# Patient Record
Sex: Female | Born: 1960
Health system: Southern US, Community
[De-identification: ages and names within clinical notes are randomized; demographics above are authoritative.]

## PROBLEM LIST (undated history)

## (undated) DIAGNOSIS — B029 Zoster without complications: Secondary | ICD-10-CM

## (undated) DIAGNOSIS — Z823 Family history of stroke: Secondary | ICD-10-CM

## (undated) DIAGNOSIS — O24419 Gestational diabetes mellitus in pregnancy, unspecified control: Secondary | ICD-10-CM

## (undated) DIAGNOSIS — B009 Herpesviral infection, unspecified: Secondary | ICD-10-CM

## (undated) DIAGNOSIS — E041 Nontoxic single thyroid nodule: Secondary | ICD-10-CM

## (undated) DIAGNOSIS — R51 Headache: Secondary | ICD-10-CM

## (undated) HISTORY — DX: Herpesviral infection, unspecified: B00.9

## (undated) HISTORY — DX: Nontoxic single thyroid nodule: E04.1

## (undated) HISTORY — DX: Zoster without complications: B02.9

## (undated) HISTORY — DX: Headache: R51

## (undated) HISTORY — DX: Family history of stroke: Z82.3

## (undated) HISTORY — DX: Gestational diabetes mellitus in pregnancy, unspecified control: O24.419

---

## 1991-04-28 HISTORY — PX: TUBAL LIGATION: SHX77

## 2000-03-03 ENCOUNTER — Other Ambulatory Visit: Admission: RE | Admit: 2000-03-03 | Discharge: 2000-03-03 | Payer: Self-pay | Admitting: Obstetrics and Gynecology

## 2001-03-18 ENCOUNTER — Other Ambulatory Visit: Admission: RE | Admit: 2001-03-18 | Discharge: 2001-03-18 | Payer: Self-pay | Admitting: Obstetrics and Gynecology

## 2002-05-03 ENCOUNTER — Other Ambulatory Visit: Admission: RE | Admit: 2002-05-03 | Discharge: 2002-05-03 | Payer: Self-pay | Admitting: Obstetrics and Gynecology

## 2003-05-07 ENCOUNTER — Other Ambulatory Visit: Admission: RE | Admit: 2003-05-07 | Discharge: 2003-05-07 | Payer: Self-pay | Admitting: Obstetrics and Gynecology

## 2004-05-23 ENCOUNTER — Other Ambulatory Visit: Admission: RE | Admit: 2004-05-23 | Discharge: 2004-05-23 | Payer: Self-pay | Admitting: Obstetrics and Gynecology

## 2005-08-10 ENCOUNTER — Other Ambulatory Visit: Admission: RE | Admit: 2005-08-10 | Discharge: 2005-08-10 | Payer: Self-pay | Admitting: Obstetrics & Gynecology

## 2006-09-30 ENCOUNTER — Other Ambulatory Visit: Admission: RE | Admit: 2006-09-30 | Discharge: 2006-09-30 | Payer: Self-pay | Admitting: Obstetrics and Gynecology

## 2007-12-20 ENCOUNTER — Other Ambulatory Visit: Admission: RE | Admit: 2007-12-20 | Discharge: 2007-12-20 | Payer: Self-pay | Admitting: Obstetrics and Gynecology

## 2008-03-27 HISTORY — PX: COLONOSCOPY: SHX174

## 2008-10-25 HISTORY — PX: HYSTEROSCOPY WITH NOVASURE: SHX5574

## 2008-11-23 ENCOUNTER — Ambulatory Visit (HOSPITAL_COMMUNITY): Admission: RE | Admit: 2008-11-23 | Discharge: 2008-11-23 | Payer: Self-pay | Admitting: Obstetrics & Gynecology

## 2010-08-03 LAB — CBC
HCT: 33.5 % — ABNORMAL LOW (ref 36.0–46.0)
Hemoglobin: 11.4 g/dL — ABNORMAL LOW (ref 12.0–15.0)
MCHC: 33.9 g/dL (ref 30.0–36.0)
MCV: 91.2 fL (ref 78.0–100.0)
RBC: 3.67 MIL/uL — ABNORMAL LOW (ref 3.87–5.11)
WBC: 3.5 10*3/uL — ABNORMAL LOW (ref 4.0–10.5)

## 2010-08-03 LAB — URINALYSIS, ROUTINE W REFLEX MICROSCOPIC
Bilirubin Urine: NEGATIVE
Glucose, UA: NEGATIVE mg/dL
Ketones, ur: NEGATIVE mg/dL
Nitrite: NEGATIVE
Specific Gravity, Urine: 1.025 (ref 1.005–1.030)
pH: 5.5 (ref 5.0–8.0)

## 2010-08-03 LAB — PREGNANCY, URINE: Preg Test, Ur: NEGATIVE

## 2010-09-09 NOTE — Op Note (Signed)
NAMEPENELOPE, FITTRO            ACCOUNT NO.:  [id number]   MEDICAL RECORD NO.:  [id number]          PATIENT TYPE:  AMB   LOCATION:  SDC                           FACILITY:  [facility]   PHYSICIAN:  M. Leda Quail, MD  DATE OF BIRTH:  June 11, 1960   DATE OF PROCEDURE:  11/23/2008  DATE OF DISCHARGE:                               OPERATIVE REPORT   PREOPERATIVE DIAGNOSES:  54. A 50 year old G3 P3 married white female with history of      menorrhagia.  2. Anemia.  3. Normal spontaneous vaginal delivery x3.  4. History of bilateral tubal ligation.   POSTOPERATIVE DIAGNOSES:  54. A 50 year old G3 P3 married white female with history of      menorrhagia.  2. Anemia.  3. Normal spontaneous vaginal delivery x3.  4. History of bilateral tubal ligation.   PROCEDURE:  Hysteroscopy with NovaSure ablation.   ANESTHESIA:  LMA.  Dr. Jean Rosenthal oversaw the case.   SPECIMENS:  None.   ESTIMATED BLOOD LOSS:  Minimal.   FLUIDS:  1100 mL of LR.   FLUID DEFICIT:  45 mL of lactated Ringer's.   URINE OUTPUT:  I and O catheterization performed at the beginning of the  procedure.   INDICATIONS:  Ms. Rossy is a very nice 50 year old G3 P3 married white  female who has been experiencing worsening menorrhagia over the last  year.  She has finally decided to proceed with intervention.  She did  undergo an ultrasound in November 2009 that showed a normal 8 x 3.7 x  5.2-cm uterus with no fibroids or adenomyosis and the endometrial  measures 6.8 mm.  She underwent endometrial biopsy this week that showed  proliferative endometrium.  She was not interested in birth control  pills as she has used these in the past and does not like the way they  make her feel, and she is not interested in IUD as she does not the idea  of a foreign object inside the uterus.  She has been counseled about  risks and benefits, and this is all documented in our office chart.  She  presents for procedure today.   PROCEDURE:   Patient taken to the operating room.  She is placed in  supine position.  Anesthesia is administered by the anesthesia staff  without difficulty.  Legs are positioned in the lower lithotomy position  in Shoshoni stirrups.  SCDs are on lower extremities bilaterally.  Legs are  then lifted to the high lithotomy position.  Perineum, inner thighs and  vagina are prepped and draped in normal sterile fashion.  Red rubber  Foley catheter is used is to drain the bladder of all urine.  A bivalve  speculum is placed in vagina.  The anterior lip of the cervix is grasped  with single-tooth tenaculum.  A paracervical block with 1% lidocaine  mixed 1:1 with epinephrine (100,000 units) is used to anesthetize the  cervix.  Ten mL total of the solution is used.The uterus sounds to about  8.5 cm.  Using [facility] dilators, the cervix is dilated to a #19.  A  diagnostic hysteroscope is  used with lactated ringers as the  hysteroscopic media.  The hysteroscope is advanced into the cervical  canal, and endometrial cavity is visualized.  A thin endometrium is  noted.  Tubal ostia are noted bilaterally, and photo documentation is  made.  The hysteroscope is removed.  Then, using Hegar dilators, the  cervix is dilated up to a #8.  The cervical length measures 4 cm.  Therefore, the cavity length measures 4.5 cm.  NovaSure device is  obtained.  The array is opened outside the patient.  The vacuum valve is  tapped to make sure it is working properly.  The array is closed and the  device is passed in through cervix.  The device is opened inside the  endometrial cavity.  The device is then seated by moving the device up  and down to the right and left and twisting it 90 degrees to the right  and left.  This is done twice.  The device opens to 4.2.  The cervical  seal is placed across the cervix, and cavity assessment test is  performed without difficulty.  The patient passes the cavity assessment  test without difficulty.   Then, the ablation cycle is initiated.  The  cycle lasts 1 minute and 6 seconds.  Once ablation cycle is completed,  the array is closed.  The device is removed from the cervix.  The  tenacula is removed from the anterior lip of the cervix.  No bleeding is  noted.  Hysteroscope is used to visualize endometrial cavity again, and  excellent cautery is noted.  Photo documentation is made.  At this  point, all instruments are removed from the vagina.  The Betadine prep  is cleansed from the skin.  Sponge, lap, needle and sponge counts are  correct x2.  The patient tolerates the procedure well.  30 mg IV Toradol  is given.  She is awakened from anesthesia and taken to the recovery in  stable addition.      Lum Keas, MD  Electronically Signed     MSM/MEDQ  D:  11/23/2008  T:  11/24/2008  Job:  [id number]

## 2012-05-20 ENCOUNTER — Encounter: Payer: Self-pay | Admitting: *Deleted

## 2012-07-14 ENCOUNTER — Ambulatory Visit (INDEPENDENT_AMBULATORY_CARE_PROVIDER_SITE_OTHER): Payer: BC Managed Care – PPO | Admitting: Obstetrics & Gynecology

## 2012-07-14 ENCOUNTER — Encounter: Payer: Self-pay | Admitting: Obstetrics & Gynecology

## 2012-07-14 VITALS — BP 98/60 | Ht 64.0 in | Wt 124.0 lb

## 2012-07-14 DIAGNOSIS — Z01419 Encounter for gynecological examination (general) (routine) without abnormal findings: Secondary | ICD-10-CM

## 2012-07-14 DIAGNOSIS — Z0000 Encounter for general adult medical examination without abnormal findings: Secondary | ICD-10-CM

## 2012-07-14 LAB — POCT URINALYSIS DIPSTICK
Bilirubin, UA: NEGATIVE
Blood, UA: NEGATIVE
Glucose, UA: NEGATIVE
Nitrite, UA: NEGATIVE
Urobilinogen, UA: NEGATIVE

## 2012-07-14 NOTE — Progress Notes (Signed)
52 y.o. G3P3 MarriedCaucasianF here for annual exam.  No bleeding.  Doing really well.    Patient's last menstrual period was 10/25/2008.          Sexually active: yes  The current method of family planning is tubal ligation and ablation..    Exercising: yes  The patient does not participate in regular exercise at present. Smoker:  no  Health Maintenance: Pap:  06/18/2011 (neg HR HPV) MMG:  03/03/12 additional views 04/08/2012 Colonoscopy:  12/09 BMD:  Never done one TDaP:  06/08   reports that she has never smoked. She has never used smokeless tobacco. She reports that  drinks alcohol. She reports that she does not use illicit drugs.  Past Medical History  Diagnosis Date  . HSV (herpes simplex virus) infection   . Headache     migraines    Past Surgical History  Procedure Laterality Date  . Tubal ligation  1993  . Hysteroscopy with novasure  10/2008  . Colonoscopy  12/09    normal w/Dr. Magod--next due 2019    Current Outpatient Prescriptions  Medication Sig Dispense Refill  . aspirin (ASPIRIN LOW DOSE) 81 MG tablet Take 81 mg by mouth 1 day or 1 dose.      . B Complex Vitamins (VITAMIN B COMPLEX PO) Take by mouth daily.      Marland Kitchen CALCIUM PO Take by mouth daily.      . Diphenhydramine-APAP, sleep, (TYLENOL PM EXTRA STRENGTH PO) Take by mouth as needed.      . Docosahexaenoic Acid (DHA OMEGA 3 PO) Take by mouth daily.      Marland Kitchen MAGNESIUM PO Take by mouth daily.      Marland Kitchen MELATONIN ER PO Take by mouth at bedtime.      . Montelukast Sodium (SINGULAIR PO) Take by mouth daily.      . Multiple Vitamins-Minerals (MULTIVITAMIN PO) Take by mouth daily.      . Omega-3 Fatty Acids (FISH OIL PO) Take by mouth daily.      . potassium chloride SA (K-DUR,KLOR-CON) 20 MEQ tablet Take 20 mEq by mouth daily.      Marland Kitchen SALINE NASAL SPRAY NA Place into the nose as needed.      Marland Kitchen VITAMIN D, CHOLECALCIFEROL, PO Take by mouth daily.       No current facility-administered medications for this visit.     History reviewed. No pertinent family history.  ROS:  Pertinent items are noted in HPI.  Otherwise, a comprehensive ROS was negative.  Exam:   BP 98/60  Ht 5\' 4"  (1.626 m)  Wt 124 lb (56.246 kg)  BMI 21.27 kg/m2  LMP 10/25/2008   Height: 5\' 4"  (162.6 cm)  Ht Readings from Last 3 Encounters:  07/14/12 5\' 4"  (1.626 m)    General appearance: alert, cooperative and appears stated age Head: Normocephalic, without obvious abnormality, atraumatic Neck: no adenopathy, supple, symmetrical, trachea midline and thyroid not enlarged, symmetric, no tenderness/mass/nodules Lungs: clear to auscultation bilaterally Breasts: Inspection negative, No nipple retraction or dimpling, No nipple discharge or bleeding, No axillary or supraclavicular adenopathy, Linear "ropey" area left upper outer quadrant around 2 o'clock approx 4cm from areola Heart: regular rate and rhythm Abdomen: soft, non-tender; bowel sounds normal; no masses,  no organomegaly Extremities: extremities normal, atraumatic, no cyanosis or edema Skin: Skin color, texture, turgor normal. No rashes or lesions Lymph nodes: Cervical, supraclavicular, and axillary nodes normal. No abnormal inguinal nodes palpated Neurologic: Grossly normal   Pelvic: External genitalia:  no lesions              Urethra:  normal appearing urethra with no masses, tenderness or lesions              Bartholins and Skenes: normal                 Vagina: normal appearing vagina with normal color and discharge, no lesions              Cervix: no lesions              Pap taken: no Bimanual Exam:  Uterus:  normal size, contour, position, consistency, mobility, non-tender              Adnexa: normal adnexa               Rectovaginal: Confirms               Anus:  normal sphincter tone, no lesions  A:  Well Woman with normal exam, no HRT use.  New left breast finding c/w fibrocystic changes.  P:   return annually for routine exam Recheck six weeks for  repeat breast exam due to new finding today.  Patient had MMG with call back images/ultrasound 11/13 and 12/13.  An After Visit Summary was printed and given to the patient.

## 2012-07-14 NOTE — Patient Instructions (Signed)
Please call if you have any new breast findings.  I will see you again in six weeks.

## 2012-08-29 ENCOUNTER — Encounter: Payer: Self-pay | Admitting: *Deleted

## 2012-08-30 ENCOUNTER — Ambulatory Visit (INDEPENDENT_AMBULATORY_CARE_PROVIDER_SITE_OTHER): Payer: BC Managed Care – PPO | Admitting: Obstetrics & Gynecology

## 2012-08-30 ENCOUNTER — Encounter: Payer: Self-pay | Admitting: Obstetrics & Gynecology

## 2012-08-30 VITALS — BP 90/58 | HR 60 | Resp 12

## 2012-08-30 DIAGNOSIS — N6320 Unspecified lump in the left breast, unspecified quadrant: Secondary | ICD-10-CM

## 2012-08-30 DIAGNOSIS — N63 Unspecified lump in unspecified breast: Secondary | ICD-10-CM

## 2012-08-30 NOTE — Progress Notes (Signed)
   Subjective:   52 y.o. MarriedCaucasian female presents for evaluation of left breast. Increased area of nodularity noted at AEX on 3/20.  She did have MMG 11/13 with follow-up images 12/13.  This was in [facility].  She has been seen at [facility] in the past.  Denies pain, nipple discharge, trauma, bruising.  She has been feeling the area and seems to think is about the same.  No recent infections/bites.  Review of Systems Pertinent items are noted in HPI.   Objective:   General appearance: alert and cooperative Head: Normocephalic, without obvious abnormality, atraumatic Neck: no adenopathy, supple, symmetrical, trachea midline and thyroid not enlarged, symmetric, no tenderness/mass/nodules Breasts: normal appearance, no masses or tenderness, No nipple retraction or dimpling, No nipple discharge or bleeding, positive findings: fibrocystic changes and   nodule located on the left upper outer quadrant.  This is at 2 o'clock about 4cm from areola.   Assessment:   ASSESSMENT:Patient is diagnosed with breast mass and increased nodularity LUOQ   Plan:   PLAN: Diagnostic MMG Left breast at [facility].  Pt will be contacted after I have results.

## 2012-08-30 NOTE — Patient Instructions (Signed)
We will call after your follow up is complete.

## 2012-09-01 ENCOUNTER — Telehealth: Payer: Self-pay | Admitting: *Deleted

## 2012-09-01 NOTE — Telephone Encounter (Signed)
[facility] FAXED FOR PROCEDURE TO BE DONE ON MAY 15TH @2 :00PM

## 2012-12-13 ENCOUNTER — Ambulatory Visit (INDEPENDENT_AMBULATORY_CARE_PROVIDER_SITE_OTHER): Payer: BC Managed Care – PPO | Admitting: Obstetrics & Gynecology

## 2012-12-13 VITALS — BP 102/64 | HR 60 | Resp 12 | Ht 64.25 in | Wt 123.4 lb

## 2012-12-13 DIAGNOSIS — R922 Inconclusive mammogram: Secondary | ICD-10-CM

## 2012-12-13 NOTE — Progress Notes (Signed)
Subjective:     Patient ID: Kiara Gray, female   DOB: 11-Nov-1960, 52 y.o.   MRN: [id number]  HPI  52 y.o. MarriedCaucasian female presents for evaluation of left breast. Increased area of nodularity noted at AEX on 07/14/12. She did have MMG 11/13 with follow-up images 12/13. This was in [facility]. I asked her to return for repeat exam in 6 weeks and area was still palpable.  She was sent for Diagnostic imaging.  She went to [place] where she had gone in the past.  Result is in chart.  3D imagine done on left breast as well.  Denies pain, nipple discharge, trauma, bruising. She has been feeling the area and seems to think is about the same but not completely sore. No recent infections/bites  Had MMG at [facility] on 09/08/12 which was negative.    Review of Systems  All other systems reviewed and are negative.      Objective:   Physical Exam  Constitutional: She appears well-developed and well-nourished.  HENT:  Head: Normocephalic and atraumatic.  Neck: Normal range of motion. Neck supple. No tracheal deviation present. No thyromegaly present.  Pulmonary/Chest: Right breast exhibits no inverted nipple, no mass, no nipple discharge, no skin change and no tenderness. Left breast exhibits no inverted nipple, no mass, no nipple discharge, no skin change and no tenderness. Breasts are symmetrical.  Lymphadenopathy:    She has no cervical adenopathy.       Assessment:     Breast nodularity on physical exam 07/14/12.  Now resolved.   Grade 3 breast density     Plan:     D/W pt dense breast finding and need to have yearly 3D imaging.  She knows there is still a lot of research going on with 3D MMG and that we will continue to discuss screening recommendations.  All questions answered.

## 2012-12-13 NOTE — Patient Instructions (Addendum)
Please call with any problems or changes. 

## 2012-12-16 ENCOUNTER — Encounter: Payer: Self-pay | Admitting: Obstetrics & Gynecology

## 2013-02-14 ENCOUNTER — Telehealth: Payer: Self-pay | Admitting: Obstetrics & Gynecology

## 2013-02-14 NOTE — Telephone Encounter (Signed)
Patient calling wanting to find out the brand name of her ablation she had done her a couple of years ago?

## 2013-02-14 NOTE — Telephone Encounter (Signed)
Patient had NovaSure Ablation done 11/23/08. Spoke with patient. She wanted to make sure she was recommending right procedure to her friend.   Routing to provider for fyi, will close encounter.

## 2013-03-02 ENCOUNTER — Other Ambulatory Visit: Payer: Self-pay

## 2013-06-27 ENCOUNTER — Encounter: Payer: Self-pay | Admitting: Obstetrics & Gynecology

## 2013-07-18 ENCOUNTER — Ambulatory Visit: Payer: BC Managed Care – PPO | Admitting: Obstetrics & Gynecology

## 2013-07-26 DIAGNOSIS — B029 Zoster without complications: Secondary | ICD-10-CM

## 2013-07-26 HISTORY — DX: Zoster without complications: B02.9

## 2013-08-07 ENCOUNTER — Encounter: Payer: Self-pay | Admitting: Obstetrics & Gynecology

## 2013-08-07 ENCOUNTER — Ambulatory Visit (INDEPENDENT_AMBULATORY_CARE_PROVIDER_SITE_OTHER): Payer: BC Managed Care – PPO | Admitting: Obstetrics & Gynecology

## 2013-08-07 ENCOUNTER — Telehealth: Payer: Self-pay | Admitting: Obstetrics & Gynecology

## 2013-08-07 VITALS — BP 98/58 | HR 60 | Resp 16 | Ht 64.0 in | Wt 119.2 lb

## 2013-08-07 DIAGNOSIS — Z01419 Encounter for gynecological examination (general) (routine) without abnormal findings: Secondary | ICD-10-CM

## 2013-08-07 DIAGNOSIS — Z0000 Encounter for general adult medical examination without abnormal findings: Secondary | ICD-10-CM

## 2013-08-07 DIAGNOSIS — Z124 Encounter for screening for malignant neoplasm of cervix: Secondary | ICD-10-CM

## 2013-08-07 DIAGNOSIS — N390 Urinary tract infection, site not specified: Secondary | ICD-10-CM

## 2013-08-07 MED ORDER — NITROFURANTOIN MONOHYD MACRO 100 MG PO CAPS: ORAL_CAPSULE | ORAL | Status: DC

## 2013-08-07 MED ORDER — SULFAMETHOXAZOLE-TMP DS 800-160 MG PO TABS: 1.0000 | ORAL_TABLET | Freq: Two times a day (BID) | ORAL | Status: DC

## 2013-08-07 NOTE — Progress Notes (Signed)
53 y.o. G3P3 Sep CaucasianF here for annual exam.  Separated for months.  This has been a really good decision for patient.  Husband has been in rehab due to alcohol and pills.  He was having an affair as well.    Dating a really nice man.  This is going very slowly.  Newly SA since mid-May.  Has been treated for UTI once.  Feels symptoms again today.  Labs done with health program through work [facility]([employer]).   Has lost 6 pounds.  Not actively trying.  Stress of marriage caused her weight loss.   Patient's last menstrual period was 10/25/2008.          Sexually active: yes  The current method of family planning is tubal ligation.    Exercising: yes   Smoker:  no  Health Maintenance: Pap:  06/18/11 WNL/negative HR HPV History of abnormal Pap:  no MMG:  2014 MMG, 5/14-additional views left breast-normal Colonoscopy:  12/09-repeat in 10 years BMD:   none TDaP:  2008 Screening Labs: through work, Hb today: declined, Urine today: took AZO   reports that she has never smoked. She has never used smokeless tobacco. She reports that she drinks about 1 - 1.5 ounces of alcohol per week. She reports that she does not use illicit drugs.  Past Medical History  Diagnosis Date  . HSV (herpes simplex virus) infection   . Headache(784.0)     migraines  . Gestational diabetes     Past Surgical History  Procedure Laterality Date  . Tubal ligation  1993  . Hysteroscopy with novasure  10/2008  . Colonoscopy  12/09    normal w/Dr. Magod--next due 2019    Current Outpatient Prescriptions  Medication Sig Dispense Refill  . aspirin (ASPIRIN LOW DOSE) 81 MG tablet Take 81 mg by mouth 1 day or 1 dose.      . B Complex Vitamins (VITAMIN B COMPLEX PO) Take by mouth daily.      Marland Kitchen. CALCIUM PO Take by mouth daily.      . Docosahexaenoic Acid (DHA OMEGA 3 PO) Take by mouth daily.      . fluticasone (FLONASE) 50 MCG/ACT nasal spray 50 sprays as needed.      Marland Kitchen. KRILL OIL PO Take by mouth daily.      Marland Kitchen. MAGNESIUM  PO Take by mouth daily.      Marland Kitchen. MELATONIN ER PO Take by mouth at bedtime.      . Montelukast Sodium (SINGULAIR PO) Take by mouth daily.      . Multiple Vitamins-Minerals (MULTIVITAMIN PO) Take by mouth daily.      . potassium chloride SA (K-DUR,KLOR-CON) 20 MEQ tablet Take 20 mEq by mouth daily.      Marland Kitchen. SALINE NASAL SPRAY NA Place into the nose as needed.      Marland Kitchen. VITAMIN D, CHOLECALCIFEROL, PO Take by mouth daily.       No current facility-administered medications for this visit.    Family History  Problem Relation Age of Onset  . Hypertension Father   . CVA Father   . Varicose Veins Mother     ROS:  Pertinent items are noted in HPI.  Otherwise, a comprehensive ROS was negative.  Exam:   BP 98/58  Pulse 60  Resp 16  Ht 5\' 4"  (1.626 m)  Wt 119 lb 3.2 oz (54.069 kg)  BMI 20.45 kg/m2  LMP 10/25/2008  Weight change: -6#  Height: 5\' 4"  (162.6 cm)  Ht Readings  from Last 3 Encounters:  08/07/13 5\' 4"  (1.626 m)  12/13/12 5' 4.25" (1.632 m)  07/14/12 5\' 4"  (1.626 m)    General appearance: alert, cooperative and appears stated age Head: Normocephalic, without obvious abnormality, atraumatic Neck: no adenopathy, supple, symmetrical, trachea midline and thyroid normal to inspection and palpation Lungs: clear to auscultation bilaterally Breasts: normal appearance, no masses or tenderness Heart: regular rate and rhythm Abdomen: soft, non-tender; bowel sounds normal; no masses,  no organomegaly Extremities: extremities normal, atraumatic, no cyanosis or edema Skin: Skin color, texture, turgor normal. No rashes or lesions Lymph nodes: Cervical, supraclavicular, and axillary nodes normal. No abnormal inguinal nodes palpated Neurologic: Grossly normal   Pelvic: External genitalia:  no lesions              Urethra:  normal appearing urethra with no masses, tenderness or lesions              Bartholins and Skenes: normal                 Vagina: normal appearing vagina with normal color  and discharge, no lesions              Cervix: no lesions              Pap taken: yes Bimanual Exam:  Uterus:  normal size, contour, position, consistency, mobility, non-tender              Adnexa: normal adnexa and no mass, fullness, tenderness               Rectovaginal: Confirms               Anus:  normal sphincter tone, no lesions  A:  Well Woman with normal exam UTI PMP, no HRT H/O endometrial ablation 12/09  P:   mammogram pap smear Bactrim ds BID x 5 days.  Will start suppression macrobid 100mg  after intercourse. #30/5RF Urine culture return annually or prn  An After Visit Summary was printed and given to the patient.

## 2013-08-07 NOTE — Telephone Encounter (Signed)
Lisa @ [facility] is calling to verify a prescription(Macrobid) that was sent electronically today.

## 2013-08-07 NOTE — Patient Instructions (Signed)

## 2013-08-07 NOTE — Telephone Encounter (Signed)
S/w Misty StanleyLisa clarified rx that was sent she needed to know the directions for insurance purposes.  Patient is to take 1 tablet orally as directed (after intercourse)

## 2013-08-11 ENCOUNTER — Telehealth: Payer: Self-pay

## 2013-08-11 LAB — URINE CULTURE: Colony Count: 60000

## 2013-08-11 LAB — IPS PAP SMEAR ONLY

## 2013-08-11 NOTE — Telephone Encounter (Signed)
Lmtcb//kn 

## 2013-08-11 NOTE — Addendum Note (Signed)
Addended by: Jerene BearsMILLER, Maykel Reitter S on: 08/11/2013 09:25 AM   Modules accepted: Orders

## 2013-08-11 NOTE — Telephone Encounter (Signed)
Returning a call to Kelly °

## 2013-08-11 NOTE — Telephone Encounter (Signed)
Message copied by Elisha HeadlandNIX, Totiana Everson S on Fri Aug 11, 2013 11:24 AM ------      Message from: Jerene BearsMILLER, MARY S      Created: Fri Aug 11, 2013  9:25 AM       Informed Via Mychart of result.  She does need a repeat culture in two weeks.  Aware you will call.  Order placed.              Kiara Gray,      Your urine culture is positive for E Coli--a common bacteria causing urinary tract infections.  You will need to have a repeat urine culture in about two weeks.  My nurse, Tresa EndoKelly, will call to schedule this.  Please let me know if you are not feeling better.  Once you are done with the antibiotics, you can use the Macrobid for "suppression" of another UTI.            Dr. Hyacinth MeekerMiller ------

## 2013-08-24 NOTE — Telephone Encounter (Signed)
Patient aware of results-will call back to schedule toc//kn

## 2013-12-26 ENCOUNTER — Telehealth: Payer: Self-pay | Admitting: Obstetrics & Gynecology

## 2013-12-26 ENCOUNTER — Ambulatory Visit (INDEPENDENT_AMBULATORY_CARE_PROVIDER_SITE_OTHER): Payer: BC Managed Care – PPO | Admitting: Nurse Practitioner

## 2013-12-26 ENCOUNTER — Encounter: Payer: Self-pay | Admitting: Nurse Practitioner

## 2013-12-26 VITALS — BP 100/72 | HR 64 | Temp 98.4°F | Ht 64.0 in | Wt 117.0 lb

## 2013-12-26 DIAGNOSIS — R3915 Urgency of urination: Secondary | ICD-10-CM

## 2013-12-26 DIAGNOSIS — N952 Postmenopausal atrophic vaginitis: Secondary | ICD-10-CM

## 2013-12-26 LAB — POCT URINALYSIS DIPSTICK
Bilirubin, UA: NEGATIVE
Blood, UA: NEGATIVE
GLUCOSE UA: NEGATIVE
Ketones, UA: NEGATIVE
NITRITE UA: POSITIVE
Protein, UA: NEGATIVE
UROBILINOGEN UA: NEGATIVE
pH, UA: 6

## 2013-12-26 MED ORDER — CIPROFLOXACIN HCL 500 MG PO TABS: 500.0000 mg | ORAL_TABLET | Freq: Two times a day (BID) | ORAL | Status: DC

## 2013-12-26 MED ORDER — FLUCONAZOLE 150 MG PO TABS: 150.0000 mg | ORAL_TABLET | Freq: Once | ORAL | Status: DC

## 2013-12-26 NOTE — Telephone Encounter (Signed)
Patient is not sure if she was supposed to come in for another urine check.

## 2013-12-26 NOTE — Patient Instructions (Signed)

## 2013-12-26 NOTE — Telephone Encounter (Signed)
Spoke with patient. Patient states that she thinks she has another UTI and possibly a yeast infection. Patient would like to come by to "drop off a urine sample." Advised patient that we are not able to treat without an office visit and will need a culture to test for yeast. Advised patient will need office visit for evaluation of UTI and yeast infection. Patient is agreeable. Appointment scheduled for today at 11:15am with Lauro Franklin, FNP. Patient is agreeable to date and time.   Routing to [place], FNP  Cc: Dr.Miller  Routing to provider for final review. Patient agreeable to disposition. Will close encounter

## 2013-12-26 NOTE — Progress Notes (Signed)
Subjective:     Patient ID: Kiara Gray, female   DOB: 03/19/1961, 53 y.o.   MRN: [id number]  Urinary Tract Infection  Associated symptoms include frequency and urgency. Pertinent negatives include no chills, flank pain, nausea or vomiting.    This 53 yo Fe complains of UTI symptoms for week.  She had urinary urgency and frequency along with generally not feeling well with fatigue and achy feeling.  Over the weekend she took extra Macrobid that she uses prophylactic.  That did not seem to help.   Then last pm thought maybe a yeast infection and used Monistat 1 day treatment.  She denies fever and chill.  This is a new relationship for her since last October.  Just became SA in April and has had a few infections since then.  She is postmenopausal and having vaginal dryness as well.  She wonders if that is causing an increase in symptoms as well.  Uses OTC Replens prn.     Review of Systems  Constitutional: Positive for fatigue. Negative for fever, chills and diaphoresis.  Gastrointestinal: Negative.  Negative for nausea, vomiting, abdominal pain, diarrhea, constipation and abdominal distention.  Genitourinary: Positive for urgency, frequency and vaginal discharge. Negative for dysuria, flank pain, vaginal bleeding, vaginal pain, pelvic pain and dyspareunia.  Musculoskeletal: Negative.   Skin: Negative.   Neurological: Negative.   Psychiatric/Behavioral: Negative.        Objective:   Physical Exam  Constitutional: She is oriented to person, place, and time. She appears well-developed and well-nourished. No distress.  Abdominal: Soft. She exhibits no distension. There is no tenderness. There is no rebound and no guarding.  No flank pain  Genitourinary:  White thick vaginal discharge from the Monistat.  Wet prep is not done.  Maybe slight tender over the bladder. No other adnexal tenderness. Urine: + leuk's and nitrates  Neurological: She is alert and oriented to person, place, and time.   Psychiatric: She has a normal mood and affect. Her behavior is normal. Judgment and thought content normal.       Assessment:     R/O UTI History of PC UTI on prophylactic med's Yeast vaginitis - already treated by patient     Plan:     Cipro 500 mg BID for a week Follow with urine C&S, micro TOC if urine is positive Diflucan 150 mg X 2 Discussed measures to help with atrophy and using EVOO Discussed that partner may need to be evaluated for prostatitis if continues to have infections

## 2013-12-27 LAB — URINALYSIS, MICROSCOPIC ONLY
Bacteria, UA: NONE SEEN
Casts: NONE SEEN
Crystals: NONE SEEN

## 2013-12-28 LAB — URINE CULTURE

## 2013-12-29 ENCOUNTER — Telehealth: Payer: Self-pay | Admitting: *Deleted

## 2013-12-29 MED ORDER — AMPICILLIN 500 MG PO CAPS: 500.0000 mg | ORAL_CAPSULE | Freq: Two times a day (BID) | ORAL | Status: AC

## 2013-12-29 NOTE — Telephone Encounter (Signed)
Pt notified of results.  New RX sent to [facility].  Pt will call back for TOC.

## 2013-12-29 NOTE — Telephone Encounter (Signed)
Message copied by Luisa Dago on Fri Dec 29, 2013 11:10 AM ------      Message from: Ria Comment R      Created: Thu Dec 28, 2013  2:43 PM       Let patient know that she does have infection - but this is group B strep.  She will need to change antibiotics to Ampicillin 500 mg twice a day for a week and schedule TOC in 2 weeks. ------

## 2013-12-29 NOTE — Telephone Encounter (Signed)
I have attempted to contact this patient by phone with the following results: left message to return call to [place] at [phone] on answering machine (mobile per [facility]).  Pt name was identified in voicemail.  Advised pt culture did grow bacteria and we need to change antibiotics.  Requested call back from pt to verify pharmacy.

## 2014-01-01 NOTE — Progress Notes (Signed)
Encounter reviewed by Dr. Ronold Hardgrove Silva.  

## 2014-02-26 ENCOUNTER — Encounter: Payer: Self-pay | Admitting: Nurse Practitioner

## 2014-08-14 ENCOUNTER — Ambulatory Visit (INDEPENDENT_AMBULATORY_CARE_PROVIDER_SITE_OTHER): Payer: BLUE CROSS/BLUE SHIELD | Admitting: Obstetrics & Gynecology

## 2014-08-14 ENCOUNTER — Encounter: Payer: Self-pay | Admitting: Obstetrics & Gynecology

## 2014-08-14 VITALS — BP 96/66 | HR 88 | Resp 16 | Ht 64.0 in | Wt 119.0 lb

## 2014-08-14 DIAGNOSIS — Z01419 Encounter for gynecological examination (general) (routine) without abnormal findings: Secondary | ICD-10-CM

## 2014-08-14 DIAGNOSIS — Z0000 Encounter for general adult medical examination without abnormal findings: Secondary | ICD-10-CM

## 2014-08-14 LAB — POCT URINALYSIS DIPSTICK
BILIRUBIN UA: NEGATIVE
GLUCOSE UA: NEGATIVE
Ketones, UA: NEGATIVE
LEUKOCYTES UA: NEGATIVE
NITRITE UA: NEGATIVE
Protein, UA: NEGATIVE
RBC UA: NEGATIVE
Urobilinogen, UA: NEGATIVE
pH, UA: 5

## 2014-08-14 MED ORDER — NITROFURANTOIN MONOHYD MACRO 100 MG PO CAPS: ORAL_CAPSULE | ORAL | Status: DC

## 2014-08-14 NOTE — Progress Notes (Signed)
54 y.o. G3P3 Divorced CaucasianF here for annual exam.  Divorce was finalized.  Still dating same person.  Doing well.  Had shingles last week.  Lesions are all drying up.  Didn't end up with an antiviral due to delay in being seen.  She left for a cruise the day the breakout started.    No vaginal bleeding.    Patient's last menstrual period was 04/27/2008.          Sexually active: Yes.    The current method of family planning is tubal ligation.    Exercising: Yes.    Walking, yoga Smoker:  no  Health Maintenance: Pap: 08/07/13 Neg History of abnormal Pap:  no MMG: 09/12/13 BIRADS1:neg Self Breast Exam: yes, every couple of months. Colonoscopy: 03/2008 repeat 10 years  BMD:   never TDaP: 2008 Screening Labs: not today, Hb today: no, Urine today: Negative.   reports that she has never smoked. She has never used smokeless tobacco. She reports that she drinks about 1.0 - 1.5 oz of alcohol per week. She reports that she does not use illicit drugs.  Past Medical History  Diagnosis Date  . HSV (herpes simplex virus) infection   . Headache(784.0)     migraines  . Gestational diabetes     Past Surgical History  Procedure Laterality Date  . Tubal ligation  1993  . Hysteroscopy with novasure  10/2008  . Colonoscopy  12/09    normal w/Dr. Magod--next due 2019    Current Outpatient Prescriptions  Medication Sig Dispense Refill  . aspirin (ASPIRIN LOW DOSE) 81 MG tablet Take 81 mg by mouth 1 day or 1 dose.    . B Complex Vitamins (VITAMIN B COMPLEX PO) Take by mouth daily.    Marland Kitchen CALCIUM PO Take by mouth daily.    . ciprofloxacin (CIPRO) 500 MG tablet Take 1 tablet (500 mg total) by mouth 2 (two) times daily. 14 tablet 0  . Docosahexaenoic Acid (DHA OMEGA 3 PO) Take by mouth daily.    Marland Kitchen KRILL OIL PO Take by mouth daily.    Marland Kitchen MAGNESIUM PO Take by mouth daily.    Marland Kitchen MELATONIN ER PO Take by mouth at bedtime.    . Montelukast Sodium (SINGULAIR PO) Take by mouth daily.    . Multiple  Vitamins-Minerals (MULTIVITAMIN PO) Take by mouth daily.    . potassium chloride SA (K-DUR,KLOR-CON) 20 MEQ tablet Take 20 mEq by mouth daily.    Marland Kitchen SALINE NASAL SPRAY NA Place into the nose as needed.    Marland Kitchen VITAMIN D, CHOLECALCIFEROL, PO Take by mouth daily.     No current facility-administered medications for this visit.    Family History  Problem Relation Age of Onset  . Hypertension Father   . CVA Father   . Varicose Veins Mother     ROS:  Pertinent items are noted in HPI.  Otherwise, a comprehensive ROS was negative.  Exam:   BP 96/66 mmHg  Pulse 88  Resp 16  Ht  (1.626 m)  Wt 119 lb (53.978 kg)  BMI 20.42 kg/m2  LMP 04/27/2008   Height:  (162.6 cm)  Ht Readings from Last 3 Encounters:  08/14/14  (1.626 m)  12/26/13  (1.626 m)  08/07/13  (1.626 m)   General appearance: alert, cooperative and appears stated age Head: Normocephalic, without obvious abnormality, atraumatic Neck: no adenopathy, supple, symmetrical, trachea midline and thyroid normal to inspection and palpation Lungs: clear to auscultation  bilaterally Breasts: normal appearance, no masses or tenderness Heart: regular rate and rhythm Abdomen: soft, non-tender; bowel sounds normal; no masses,  no organomegaly Extremities: extremities normal, atraumatic, no cyanosis or edema Skin: Skin color, texture, turgor normal. No rashes or lesions Lymph nodes: Cervical, supraclavicular, and axillary nodes normal. No abnormal inguinal nodes palpated Neurologic: Grossly normal   Pelvic: External genitalia:  no lesions              Urethra:  normal appearing urethra with no masses, tenderness or lesions              Bartholins and Skenes: normal                 Vagina: normal appearing vagina with normal color and discharge, no lesions              Cervix: no lesions              Pap taken: No. Bimanual Exam:  Uterus:  normal size, contour, position, consistency, mobility, non-tender               Adnexa: normal adnexa and no mass, fullness, tenderness               Rectovaginal: Confirms               Anus:  normal sphincter tone, no lesions  Chaperone was present for exam.  A:  Well Woman with normal exam PMP, no HRT H/O endometrial ablation 12/09 H/O recurrent UTIs  P: mammogram yearly pap smear 2015.  Neg HR HPV 2/13. Macrobid 100mg  post coitally.  #30/6RF. return annually or prn

## 2015-09-09 ENCOUNTER — Ambulatory Visit (INDEPENDENT_AMBULATORY_CARE_PROVIDER_SITE_OTHER): Payer: BLUE CROSS/BLUE SHIELD | Admitting: Obstetrics & Gynecology

## 2015-09-09 ENCOUNTER — Encounter: Payer: Self-pay | Admitting: Obstetrics & Gynecology

## 2015-09-09 VITALS — BP 102/62 | HR 62 | Resp 16 | Ht 64.0 in | Wt 124.6 lb

## 2015-09-09 DIAGNOSIS — Z1151 Encounter for screening for human papillomavirus (HPV): Secondary | ICD-10-CM | POA: Diagnosis not present

## 2015-09-09 DIAGNOSIS — Z01419 Encounter for gynecological examination (general) (routine) without abnormal findings: Secondary | ICD-10-CM

## 2015-09-09 DIAGNOSIS — Z0000 Encounter for general adult medical examination without abnormal findings: Secondary | ICD-10-CM | POA: Diagnosis not present

## 2015-09-09 DIAGNOSIS — Z124 Encounter for screening for malignant neoplasm of cervix: Secondary | ICD-10-CM

## 2015-09-09 LAB — POCT URINALYSIS DIPSTICK
Bilirubin, UA: NEGATIVE
Glucose, UA: NEGATIVE
KETONES UA: NEGATIVE
Leukocytes, UA: NEGATIVE
Nitrite, UA: NEGATIVE
PROTEIN UA: NEGATIVE
RBC UA: NEGATIVE
UROBILINOGEN UA: NEGATIVE
pH, UA: 5

## 2015-09-09 MED ORDER — NITROFURANTOIN MONOHYD MACRO 100 MG PO CAPS: ORAL_CAPSULE | ORAL | Status: DC

## 2015-09-09 MED ORDER — ESTRADIOL 0.1 MG/GM VA CREA
TOPICAL_CREAM | VAGINAL | Status: DC
Start: 2015-09-09 — End: 2018-05-06

## 2015-09-09 NOTE — Progress Notes (Signed)
55 y.o. [id number] MarriedCaucasianF here for annual exam.  Doing well.  No vaginal bleeding.  Dating same person now 2 1/2 to 3 years.  Not living together.    Has questions about vaginal dryness and estrogen vaginal cream.  Reports having hip pain last year.  Saw ortho.  X-ray was done and she was told she had good bone density.  She was given exercises and this made pain resolve.  PCP:  Dr. Lenise ArenaMeyers.  Has not blood work in the past year.    Patient's last menstrual period was 04/27/2008.          Sexually active: Yes.    The current method of family planning is post menopausal status.    Exercising: Yes.    walking, and weights Smoker:  no  Health Maintenance: Pap:  08/07/2013 negative History of abnormal Pap:  no MMG:  09/27/2014 BIRADS 1 negative  Colonoscopy:  2009  BMD:   none TDaP:  09/30/2006 Screening Labs: declined, Hb today: declined, Urine today: pending   reports that she has never smoked. She has never used smokeless tobacco. She reports that she drinks about 1.0 - 1.5 oz of alcohol per week. She reports that she does not use illicit drugs.  Past Medical History  Diagnosis Date  . HSV (herpes simplex virus) infection   . Headache(784.0)     migraines  . Gestational diabetes   . Shingles 4/15    Past Surgical History  Procedure Laterality Date  . Tubal ligation  1993  . Hysteroscopy with novasure  10/2008  . Colonoscopy  12/09    normal w/Dr. Magod--next due 2019    Current Outpatient Prescriptions  Medication Sig Dispense Refill  . aspirin (ASPIRIN LOW DOSE) 81 MG tablet Take 81 mg by mouth 1 day or 1 dose.    . B Complex Vitamins (VITAMIN B COMPLEX PO) Take by mouth daily.    Marland Kitchen. CALCIUM PO Take by mouth daily.    . Docosahexaenoic Acid (DHA OMEGA 3 PO) Take by mouth daily.    Marland Kitchen. KRILL OIL PO Take by mouth daily.    Marland Kitchen. MAGNESIUM PO Take by mouth daily.    Marland Kitchen. MELATONIN ER PO Take by mouth at bedtime.    . Montelukast Sodium (SINGULAIR PO) Take by mouth daily.     . Multiple Vitamins-Minerals (MULTIVITAMIN PO) Take by mouth daily.    . nitrofurantoin, macrocrystal-monohydrate, (MACROBID) 100 MG capsule 1 po as directed 30 capsule 6  . potassium chloride SA (K-DUR,KLOR-CON) 20 MEQ tablet Take 20 mEq by mouth daily.    Marland Kitchen. SALINE NASAL SPRAY NA Place into the nose as needed.    Marland Kitchen. VITAMIN D, CHOLECALCIFEROL, PO Take by mouth daily.     No current facility-administered medications for this visit.    Family History  Problem Relation Age of Onset  . Hypertension Father   . CVA Father   . Varicose Veins Mother     ROS:  Pertinent items are noted in HPI.  Otherwise, a comprehensive ROS was negative.  Exam:   General appearance: alert, cooperative and appears stated age Head: Normocephalic, without obvious abnormality, atraumatic Neck: no adenopathy, supple, symmetrical, trachea midline and thyroid normal to inspection and palpation Lungs: clear to auscultation bilaterally Breasts: normal appearance, no masses or tenderness Heart: regular rate and rhythm Abdomen: soft, non-tender; bowel sounds normal; no masses,  no organomegaly Extremities: extremities normal, atraumatic, no cyanosis or edema Skin: Skin color, texture, turgor normal. No rashes or lesions  Lymph nodes: Cervical, supraclavicular, and axillary nodes normal. No abnormal inguinal nodes palpated Neurologic: Grossly normal   Pelvic: External genitalia:  no lesions              Urethra:  normal appearing urethra with no masses, tenderness or lesions              Bartholins and Skenes: normal                 Vagina: normal appearing vagina with normal color and discharge, no lesions              Cervix: no lesions              Pap taken: Yes.   Bimanual Exam:  Uterus:  normal size, contour, position, consistency, mobility, non-tender              Adnexa: normal adnexa and no mass, fullness, tenderness               Rectovaginal: Confirms               Anus:  normal sphincter tone, no  lesions  Chaperone was present for exam.   A: Well Woman with normal exam PMP, no HRT H/O endometrial ablation 12/09 H/O recurrent UTIs Vaginal dryness H/O shingles.  She does want the vaccine this year if possible  P: mammogram yearly pap smear 2015. Neg HR HPV 2/13.  Pap and HR HPV today. Trial of estrace vaginal cream 1 gm pv twice weekly.  #42.5 gm tube/RFs sent to pharmacy. Macrobid  post coitally. #30/6RF. Rx for shingles vaccine to [facility] in [facility] Does blood work through job.  Hep C testing discussed.  Prefers not to do this today.  Will discuss next year. return annually or prn

## 2015-09-11 LAB — IPS PAP TEST WITH HPV

## 2015-10-25 ENCOUNTER — Ambulatory Visit: Payer: BLUE CROSS/BLUE SHIELD | Admitting: Obstetrics & Gynecology

## 2016-04-13 DIAGNOSIS — J3089 Other allergic rhinitis: Secondary | ICD-10-CM | POA: Diagnosis not present

## 2016-06-01 DIAGNOSIS — J301 Allergic rhinitis due to pollen: Secondary | ICD-10-CM | POA: Diagnosis not present

## 2016-08-18 DIAGNOSIS — J0140 Acute pansinusitis, unspecified: Secondary | ICD-10-CM | POA: Diagnosis not present

## 2016-09-02 DIAGNOSIS — R070 Pain in throat: Secondary | ICD-10-CM | POA: Diagnosis not present

## 2016-09-23 DIAGNOSIS — Z91010 Allergy to peanuts: Secondary | ICD-10-CM | POA: Diagnosis not present

## 2016-09-23 DIAGNOSIS — Z91018 Allergy to other foods: Secondary | ICD-10-CM | POA: Diagnosis not present

## 2016-09-23 DIAGNOSIS — J301 Allergic rhinitis due to pollen: Secondary | ICD-10-CM | POA: Diagnosis not present

## 2016-09-23 DIAGNOSIS — J3089 Other allergic rhinitis: Secondary | ICD-10-CM | POA: Diagnosis not present

## 2016-11-12 DIAGNOSIS — J3089 Other allergic rhinitis: Secondary | ICD-10-CM | POA: Diagnosis not present

## 2016-12-04 ENCOUNTER — Ambulatory Visit: Payer: BLUE CROSS/BLUE SHIELD | Admitting: Obstetrics & Gynecology

## 2016-12-04 DIAGNOSIS — Z23 Encounter for immunization: Secondary | ICD-10-CM | POA: Diagnosis not present

## 2016-12-11 DIAGNOSIS — J3081 Allergic rhinitis due to animal (cat) (dog) hair and dander: Secondary | ICD-10-CM | POA: Diagnosis not present

## 2016-12-11 DIAGNOSIS — J301 Allergic rhinitis due to pollen: Secondary | ICD-10-CM | POA: Diagnosis not present

## 2016-12-29 ENCOUNTER — Encounter: Payer: Self-pay | Admitting: Obstetrics & Gynecology

## 2016-12-29 ENCOUNTER — Ambulatory Visit (INDEPENDENT_AMBULATORY_CARE_PROVIDER_SITE_OTHER): Payer: BLUE CROSS/BLUE SHIELD | Admitting: Obstetrics & Gynecology

## 2016-12-29 VITALS — BP 92/60 | HR 70 | Resp 16 | Ht 64.0 in | Wt 122.0 lb

## 2016-12-29 DIAGNOSIS — Z01419 Encounter for gynecological examination (general) (routine) without abnormal findings: Secondary | ICD-10-CM

## 2016-12-29 DIAGNOSIS — Z23 Encounter for immunization: Secondary | ICD-10-CM

## 2016-12-29 DIAGNOSIS — Z0000 Encounter for general adult medical examination without abnormal findings: Secondary | ICD-10-CM

## 2016-12-29 LAB — POCT URINALYSIS DIPSTICK
BILIRUBIN UA: NEGATIVE
GLUCOSE UA: NEGATIVE
Ketones, UA: NEGATIVE
Leukocytes, UA: NEGATIVE
Nitrite, UA: NEGATIVE
Protein, UA: NEGATIVE
RBC UA: NEGATIVE
Urobilinogen, UA: 0.2 E.U./dL
pH, UA: 7 (ref 5.0–8.0)

## 2016-12-29 MED ORDER — NITROFURANTOIN MONOHYD MACRO 100 MG PO CAPS
ORAL_CAPSULE | ORAL | 3 refills | Status: DC
Start: 2016-12-29 — End: 2018-05-06

## 2016-12-29 NOTE — Progress Notes (Signed)
56 y.o. [id number] MarriedCaucasianF here for annual exam.  Doing well.  Denies vaginal bleeding.    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  Continues to decline blood work.    Patient's last menstrual period was 10/25/2008.          Sexually active: Yes.    The current method of family planning is post menopausal status.    Exercising: Yes.    weights, yoga, walking Smoker:  no  Health Maintenance: Pap:  09/09/15 Neg. HR HPV:neg   08/07/13 Neg  History of abnormal Pap:  no MMG:  01/21/16 BIRADS1:neg  Colonoscopy:  2009 normal. F/u 10 years  BMD:   Never TDaP:  2008.  Will update today.  Pneumonia vaccine(s):  N/A Zostavax: 11/2016 shingrix. Hep C testing: not done Screening Labs: at work Urine today: Negative    reports that she has never smoked. She has never used smokeless tobacco. She reports that she drinks about 1.0 - 1.5 oz of alcohol per week . She reports that she does not use drugs.  Past Medical History:  Diagnosis Date  . Gestational diabetes   . Headache(784.0)    migraines  . HSV (herpes simplex virus) infection   . Shingles 4/15    Past Surgical History:  Procedure Laterality Date  . COLONOSCOPY  12/09   normal w/Dr. Magod--next due 2019  . HYSTEROSCOPY WITH NOVASURE  10/2008  . TUBAL LIGATION  1993    Current Outpatient Prescriptions  Medication Sig Dispense Refill  . aspirin (ASPIRIN LOW DOSE) 81 MG tablet Take 81 mg by mouth 1 day or 1 dose.    . B Complex Vitamins (VITAMIN B COMPLEX PO) Take by mouth daily.    Marland Kitchen CALCIUM PO Take by mouth daily.    . Docosahexaenoic Acid (DHA OMEGA 3 PO) Take by mouth daily.    Marland Kitchen KRILL OIL PO Take by mouth daily.    Marland Kitchen MAGNESIUM PO Take by mouth daily.    . montelukast (SINGULAIR) 10 MG tablet Take 1 tablet by mouth daily.    . Multiple Vitamins-Minerals (MULTIVITAMIN PO) Take by mouth daily.    . nitrofurantoin, macrocrystal-monohydrate, (MACROBID) 100 MG capsule 1 po as directed 30 capsule 6  . potassium chloride SA  (K-DUR,KLOR-CON) 20 MEQ tablet Take 20 mEq by mouth daily.    Marland Kitchen SALINE NASAL SPRAY NA Place into the nose as needed.    Marland Kitchen VITAMIN D, CHOLECALCIFEROL, PO Take by mouth daily.    Marland Kitchen estradiol (ESTRACE) 0.1 MG/GM vaginal cream 1 gram vaginally twice weekly (Patient not taking: Reported on 12/29/2016) 42.5 g 3   No current facility-administered medications for this visit.     Family History  Problem Relation Age of Onset  . Hypertension Father   . CVA Father   . Varicose Veins Mother     ROS:  Pertinent items are noted in HPI.  Otherwise, a comprehensive ROS was negative.  Exam:   BP 92/60 (BP Location: Right Arm, Patient Position: Sitting, Cuff Size: Normal)   Pulse 70   Resp 16   Ht 5\' 4"  (1.626 m)   Wt 122 lb (55.3 kg)   LMP 10/25/2008   BMI 20.94 kg/m    Height: 5\' 4"  (162.6 cm)  Ht Readings from Last 3 Encounters:  12/29/16 5\' 4"  (1.626 m)  09/09/15 5\' 4"  (1.626 m)  08/14/14 5\' 4"  (1.626 m)    General appearance: alert, cooperative and appears stated age Head: Normocephalic, without obvious abnormality, atraumatic Neck: no adenopathy,  supple, symmetrical, trachea midline and thyroid normal to inspection and palpation Lungs: clear to auscultation bilaterally Breasts: normal appearance, no masses or tenderness Heart: regular rate and rhythm Abdomen: soft, non-tender; bowel sounds normal; no masses,  no organomegaly Extremities: extremities normal, atraumatic, no cyanosis or edema Skin: Skin color, texture, turgor normal. No rashes or lesions Lymph nodes: Cervical, supraclavicular, and axillary nodes normal. No abnormal inguinal nodes palpated Neurologic: Grossly normal   Pelvic: External genitalia:  no lesions              Urethra:  normal appearing urethra with no masses, tenderness or lesions              Bartholins and Skenes: normal                 Vagina: normal appearing vagina with normal color and discharge, no lesions              Cervix: no lesions               Pap taken: No. Bimanual Exam:  Uterus:  normal size, contour, position, consistency, mobility, non-tender              Adnexa: normal adnexa and no mass, fullness, tenderness               Rectovaginal: Confirms               Anus:  normal sphincter tone, no lesions  Chaperone was present for exam.  A:  Well Woman with normal exam PMP, no HRT H/o enodmetrial ablation 12/09 H/O recurrent UTIs Vaginal ablation H/o shingles--actively getting the Shingrix vaccination  P:   Mammogram guidelines reviewed.  Doing 3D MMGs. pap smear with neg HR HPV 2017.  No pap smear today Lab work done at work.  Declines doing Hep C testing today. Rx for Macrobid 100mg  post coitally.  #30/3RF. return annually or prn

## 2017-01-13 DIAGNOSIS — Z808 Family history of malignant neoplasm of other organs or systems: Secondary | ICD-10-CM | POA: Diagnosis not present

## 2017-01-13 DIAGNOSIS — L821 Other seborrheic keratosis: Secondary | ICD-10-CM | POA: Diagnosis not present

## 2017-01-13 DIAGNOSIS — L814 Other melanin hyperpigmentation: Secondary | ICD-10-CM | POA: Diagnosis not present

## 2017-01-13 DIAGNOSIS — Z86018 Personal history of other benign neoplasm: Secondary | ICD-10-CM | POA: Diagnosis not present

## 2017-07-02 DIAGNOSIS — J3089 Other allergic rhinitis: Secondary | ICD-10-CM | POA: Diagnosis not present

## 2017-07-29 DIAGNOSIS — Z1231 Encounter for screening mammogram for malignant neoplasm of breast: Secondary | ICD-10-CM | POA: Diagnosis not present

## 2017-08-03 DIAGNOSIS — J3081 Allergic rhinitis due to animal (cat) (dog) hair and dander: Secondary | ICD-10-CM | POA: Diagnosis not present

## 2017-08-03 DIAGNOSIS — J301 Allergic rhinitis due to pollen: Secondary | ICD-10-CM | POA: Diagnosis not present

## 2017-08-24 ENCOUNTER — Encounter: Payer: Self-pay | Admitting: Obstetrics & Gynecology

## 2017-09-29 DIAGNOSIS — J301 Allergic rhinitis due to pollen: Secondary | ICD-10-CM | POA: Diagnosis not present

## 2017-09-29 DIAGNOSIS — J3089 Other allergic rhinitis: Secondary | ICD-10-CM | POA: Diagnosis not present

## 2017-09-29 DIAGNOSIS — Z91010 Allergy to peanuts: Secondary | ICD-10-CM | POA: Diagnosis not present

## 2017-09-29 DIAGNOSIS — Z91018 Allergy to other foods: Secondary | ICD-10-CM | POA: Diagnosis not present

## 2017-10-11 DIAGNOSIS — M545 Low back pain: Secondary | ICD-10-CM | POA: Diagnosis not present

## 2017-12-02 DIAGNOSIS — R0989 Other specified symptoms and signs involving the circulatory and respiratory systems: Secondary | ICD-10-CM | POA: Diagnosis not present

## 2017-12-04 ENCOUNTER — Other Ambulatory Visit: Payer: Self-pay | Admitting: Physician Assistant

## 2017-12-04 DIAGNOSIS — R198 Other specified symptoms and signs involving the digestive system and abdomen: Secondary | ICD-10-CM

## 2017-12-04 DIAGNOSIS — Z823 Family history of stroke: Secondary | ICD-10-CM

## 2017-12-04 DIAGNOSIS — R0989 Other specified symptoms and signs involving the circulatory and respiratory systems: Secondary | ICD-10-CM

## 2017-12-15 ENCOUNTER — Ambulatory Visit
Admission: RE | Admit: 2017-12-15 | Discharge: 2017-12-15 | Disposition: A | Discharge status: Home / Self Care | Payer: Self-pay | Source: Ambulatory Visit | Attending: Physician Assistant | Admitting: Physician Assistant

## 2017-12-15 ENCOUNTER — Ambulatory Visit
Admission: RE | Admit: 2017-12-15 | Discharge: 2017-12-15 | Disposition: A | Discharge status: Home / Self Care | Payer: BLUE CROSS/BLUE SHIELD | Source: Ambulatory Visit | Attending: Physician Assistant | Admitting: Physician Assistant

## 2017-12-15 ENCOUNTER — Other Ambulatory Visit: Payer: Self-pay

## 2017-12-15 DIAGNOSIS — E041 Nontoxic single thyroid nodule: Secondary | ICD-10-CM | POA: Diagnosis not present

## 2017-12-15 DIAGNOSIS — Z823 Family history of stroke: Secondary | ICD-10-CM

## 2017-12-15 DIAGNOSIS — I6523 Occlusion and stenosis of bilateral carotid arteries: Secondary | ICD-10-CM | POA: Diagnosis not present

## 2017-12-15 DIAGNOSIS — R0989 Other specified symptoms and signs involving the circulatory and respiratory systems: Secondary | ICD-10-CM

## 2017-12-15 DIAGNOSIS — R198 Other specified symptoms and signs involving the digestive system and abdomen: Secondary | ICD-10-CM

## 2017-12-30 DIAGNOSIS — Z23 Encounter for immunization: Secondary | ICD-10-CM | POA: Diagnosis not present

## 2018-01-05 DIAGNOSIS — J3089 Other allergic rhinitis: Secondary | ICD-10-CM | POA: Diagnosis not present

## 2018-01-11 DIAGNOSIS — Z808 Family history of malignant neoplasm of other organs or systems: Secondary | ICD-10-CM | POA: Diagnosis not present

## 2018-01-11 DIAGNOSIS — Z86018 Personal history of other benign neoplasm: Secondary | ICD-10-CM | POA: Diagnosis not present

## 2018-01-11 DIAGNOSIS — Z411 Encounter for cosmetic surgery: Secondary | ICD-10-CM | POA: Diagnosis not present

## 2018-01-11 DIAGNOSIS — L814 Other melanin hyperpigmentation: Secondary | ICD-10-CM | POA: Diagnosis not present

## 2018-03-01 ENCOUNTER — Encounter

## 2018-03-01 ENCOUNTER — Ambulatory Visit: Payer: BLUE CROSS/BLUE SHIELD | Admitting: Obstetrics & Gynecology

## 2018-03-03 ENCOUNTER — Telehealth: Payer: Self-pay | Admitting: *Deleted

## 2018-03-03 NOTE — Telephone Encounter (Signed)
Left voicemail to call back to schedule AEX.   Ok to schedule patient late morning 11/19 - per SM

## 2018-03-09 DIAGNOSIS — J3081 Allergic rhinitis due to animal (cat) (dog) hair and dander: Secondary | ICD-10-CM | POA: Diagnosis not present

## 2018-03-09 DIAGNOSIS — J301 Allergic rhinitis due to pollen: Secondary | ICD-10-CM | POA: Diagnosis not present

## 2018-03-10 NOTE — Telephone Encounter (Signed)
Left voicemail to call back to schedule AEX.  

## 2018-04-05 NOTE — Telephone Encounter (Signed)
Routed to Sally, RN 

## 2018-04-26 ENCOUNTER — Encounter

## 2018-05-03 NOTE — Telephone Encounter (Signed)
Attempt to call patient to schedule annual. Call disconnected before patient answered.  Patient called back and spoke to front office. Scheduled annual for 05-06-18. Previous appointment cancelled by provider.   Routing to Dr Hyacinth Meeker. Encounter closed.

## 2018-05-06 ENCOUNTER — Other Ambulatory Visit (HOSPITAL_COMMUNITY)
Admission: RE | Admit: 2018-05-06 | Discharge: 2018-05-06 | Disposition: A | Discharge status: Home / Self Care | Payer: BLUE CROSS/BLUE SHIELD | Source: Ambulatory Visit | Attending: Obstetrics & Gynecology | Admitting: Obstetrics & Gynecology

## 2018-05-06 ENCOUNTER — Encounter: Payer: Self-pay | Admitting: Obstetrics & Gynecology

## 2018-05-06 ENCOUNTER — Other Ambulatory Visit: Payer: Self-pay

## 2018-05-06 ENCOUNTER — Ambulatory Visit: Payer: BLUE CROSS/BLUE SHIELD | Admitting: Obstetrics & Gynecology

## 2018-05-06 VITALS — BP 102/70 | HR 72 | Resp 16 | Ht 63.5 in | Wt 122.6 lb

## 2018-05-06 DIAGNOSIS — Z01419 Encounter for gynecological examination (general) (routine) without abnormal findings: Secondary | ICD-10-CM

## 2018-05-06 DIAGNOSIS — Z823 Family history of stroke: Secondary | ICD-10-CM

## 2018-05-06 DIAGNOSIS — Z124 Encounter for screening for malignant neoplasm of cervix: Secondary | ICD-10-CM | POA: Insufficient documentation

## 2018-05-06 DIAGNOSIS — Z1211 Encounter for screening for malignant neoplasm of colon: Secondary | ICD-10-CM | POA: Diagnosis not present

## 2018-05-06 NOTE — Progress Notes (Signed)
58 y.o. [id number] Divorced White or Caucasian female here for annual exam.  Doing well.  Denies vaginal bleeding.    Mother died in November.  She started declining after her stroke.  Reports her younger brother had a hemorrhagia stroke this year.  Saw her PCP.  Had thyroid ultrasound that showed a nodule.  This is just going to be watched.    Has done blood work with Dr. Lenise ArenaMeyers' office.    Patient's last menstrual period was 10/25/2008.          Sexually active: Yes.    The current method of family planning is post menopausal status.    Exercising: Yes.    weights, pilates, yoga Smoker:  no  Health Maintenance: Pap:  09/09/15 Neg. HR HPV:neg   08/07/13 Neg  History of abnormal Pap:  no MMG:  07/29/17 BIRADS1:n Colonoscopy:  2009 Normal.  Will refer her to female GI.   BMD:   Never TDaP:  2018 Pneumonia vaccine(s):  n/a Shingrix:   Completed Hep C testing: has donated blood Screening Labs: PCP   reports that she has never smoked. She has never used smokeless tobacco. She reports current alcohol use of about 1.0 - 2.0 standard drinks of alcohol per week. She reports that she does not use drugs.  Past Medical History:  Diagnosis Date  . Gestational diabetes   . Headache(784.0)    migraines  . HSV (herpes simplex virus) infection   . Shingles 4/15  . Thyroid nodule     Past Surgical History:  Procedure Laterality Date  . COLONOSCOPY  12/09   normal w/Dr. Magod--next due 2019  . HYSTEROSCOPY WITH NOVASURE  10/2008  . TUBAL LIGATION  1993    Current Outpatient Medications  Medication Sig Dispense Refill  . aspirin (ASPIRIN LOW DOSE) 81 MG tablet Take 81 mg by mouth 1 day or 1 dose.    . B Complex Vitamins (VITAMIN B COMPLEX PO) Take by mouth daily.    Marland Kitchen. CALCIUM PO Take by mouth daily.    . Docosahexaenoic Acid (DHA OMEGA 3 PO) Take by mouth daily.    Marland Kitchen. KRILL OIL PO Take by mouth daily.    Marland Kitchen. MAGNESIUM PO Take by mouth daily.    . montelukast (SINGULAIR) 10 MG tablet Take 1  tablet by mouth daily.    . Multiple Vitamins-Minerals (MULTIVITAMIN PO) Take by mouth daily.    . potassium chloride SA (K-DUR,KLOR-CON) 20 MEQ tablet Take 20 mEq by mouth daily.    Marland Kitchen. SALINE NASAL SPRAY NA Place into the nose as needed.    Marland Kitchen. VITAMIN D, CHOLECALCIFEROL, PO Take by mouth daily.     No current facility-administered medications for this visit.     Family History  Problem Relation Age of Onset  . Hypertension Father   . CVA Father   . Varicose Veins Mother     Review of Systems  All other systems reviewed and are negative.   Exam:   BP 102/70 (BP Location: Right Arm, Patient Position: Sitting, Cuff Size: Normal)   Pulse 72   Resp 16   Ht 5' 3.5" (1.613 m)   Wt 122 lb 9.6 oz (55.6 kg)   LMP 10/25/2008   BMI 21.38 kg/m   Height: 5' 3.5" (161.3 cm)  Ht Readings from Last 3 Encounters:  05/06/18 5' 3.5" (1.613 m)  12/29/16 5\' 4"  (1.626 m)  09/09/15 5\' 4"  (1.626 m)    General appearance: alert, cooperative and appears stated age  Head: Normocephalic, without obvious abnormality, atraumatic Neck: no adenopathy, supple, symmetrical, trachea midline and thyroid normal to inspection and palpation Lungs: clear to auscultation bilaterally Breasts: normal appearance, no masses or tenderness Heart: regular rate and rhythm Abdomen: soft, non-tender; bowel sounds normal; no masses,  no organomegaly Extremities: extremities normal, atraumatic, no cyanosis or edema Skin: Skin color, texture, turgor normal. No rashes or lesions Lymph nodes: Cervical, supraclavicular, and axillary nodes normal. No abnormal inguinal nodes palpated Neurologic: Grossly normal   Pelvic: External genitalia:  no lesions              Urethra:  normal appearing urethra with no masses, tenderness or lesions              Bartholins and Skenes: normal                 Vagina: normal appearing vagina with normal color and discharge, no lesions              Cervix: no lesions              Pap taken:  Yes.   Bimanual Exam:  Uterus:  normal size, contour, position, consistency, mobility, non-tender              Adnexa: normal adnexa and no mass, fullness, tenderness               Rectovaginal: Confirms               Anus:  normal sphincter tone, no lesions  Chaperone was present for exam.  A:  Well Woman with normal exam PMP, no HRT H/O endometrial ablation 12/09 H/O shingles Thyroid nodule Strong family hx of stroke with younger brother having a stroke this year  P:   Mammogram guidelines reviewed. Pt will schedule BMD with [facility] in April.  Order will faxed to [facility]. pap smear obtained today.  Pap only.  Last HR HPV 2017 Referral to neurology for consultation about family hx of stroke  Referral to GI for screening colonoscopy Lab work UTD with PCPs office per pt Vaccines UTD Return annually or prn

## 2018-05-09 LAB — CYTOLOGY - PAP: Diagnosis: NEGATIVE

## 2018-05-26 DIAGNOSIS — K5900 Constipation, unspecified: Secondary | ICD-10-CM | POA: Diagnosis not present

## 2018-05-26 DIAGNOSIS — Z1211 Encounter for screening for malignant neoplasm of colon: Secondary | ICD-10-CM | POA: Diagnosis not present

## 2018-06-29 ENCOUNTER — Ambulatory Visit: Payer: BLUE CROSS/BLUE SHIELD | Admitting: Neurology

## 2018-06-29 ENCOUNTER — Encounter: Payer: Self-pay | Admitting: Neurology

## 2018-06-29 VITALS — BP 105/68 | HR 66 | Ht 63.5 in | Wt 126.3 lb

## 2018-06-29 DIAGNOSIS — Z823 Family history of stroke: Secondary | ICD-10-CM | POA: Diagnosis not present

## 2018-06-29 DIAGNOSIS — G4489 Other headache syndrome: Secondary | ICD-10-CM | POA: Diagnosis not present

## 2018-06-29 HISTORY — DX: Family history of stroke: Z82.3

## 2018-06-29 NOTE — Progress Notes (Signed)
Reason for visit: Family history of cerebrovascular disease  Referring physician: Dr. Leanor Rubenstein is a 58 y.o. female  History of present illness:  Ms. Burkle is a 58 year old right-handed white female with a history of headaches that began in the summer 2019.  The patient indicates that the headaches are generally in the back of the head and occur on average about once a week lasting all day long.  The headaches are not associated with nausea or vomiting or photophobia or phonophobia.  The patient denies any visual changes, she has no numbness or weakness of extremities.  She denies any problems with balance or difficulty controlling the bowels or the bladder.  The patient also comes to the office today as she is concerned about her family history of cerebrovascular disease.  Her mother had a hemorrhage in the posterior portion of the brain on the right resulting in some visual changes off to the left.  The patient's father died around age 53 with a brainstem stroke.  The patient's brother recently had an intracranial hemorrhage as well resulting in some residual slurred speech.  The patient is not sure what the etiology of these hemorrhages were, only the mother had some hypertension issues.  The patient is concerned that she may have increased risk for cerebrovascular disease as well.  She comes to this office for an evaluation.   Past Medical History:  Diagnosis Date  . Gestational diabetes   . Headache(784.0)    migraines  . HSV (herpes simplex virus) infection   . Shingles 4/15  . Thyroid nodule     Past Surgical History:  Procedure Laterality Date  . COLONOSCOPY  12/09   normal w/Dr. Magod--next due 2019  . HYSTEROSCOPY WITH NOVASURE  10/2008  . TUBAL LIGATION  1993    Family History  Problem Relation Age of Onset  . Hypertension Father   . CVA Father   . Stroke Father   . Varicose Veins Mother   . Stroke Mother   . Stroke Brother        hemorrhagic      Social history:  reports that she has never smoked. She has never used smokeless tobacco. She reports current alcohol use of about 1.0 - 2.0 standard drinks of alcohol per week. She reports that she does not use drugs.  Medications:  Prior to Admission medications   Medication Sig Start Date End Date Taking? Authorizing Provider  aspirin (ASPIRIN LOW DOSE) 81 MG tablet Take 81 mg by mouth 1 day or 1 dose.   Yes [provider]  B Complex Vitamins (VITAMIN B COMPLEX PO) Take by mouth daily.   Yes [provider]  CALCIUM PO Take by mouth daily.   Yes [provider]  Docosahexaenoic Acid (DHA OMEGA 3 PO) Take by mouth daily.   Yes [provider]  KRILL OIL PO Take by mouth daily.   Yes [provider]  MAGNESIUM PO Take by mouth daily.   Yes [provider]  montelukast (SINGULAIR) 10 MG tablet Take 1 tablet by mouth daily. 12/02/16  Yes [provider]  Multiple Vitamins-Minerals (MULTIVITAMIN PO) Take by mouth daily.   Yes [provider]  potassium chloride SA (K-DUR,KLOR-CON) 20 MEQ tablet Take 20 mEq by mouth daily.   Yes [provider]  SALINE NASAL SPRAY NA Place into the nose as needed.   Yes [provider]  VITAMIN D, CHOLECALCIFEROL, PO Take by mouth daily.  Yes [provider]      Allergies  Allergen Reactions  . Peanut-Containing Drug Products Itching  . Soy Allergy Swelling    ROS:  Out of a complete 14 system review of symptoms, the patient complains only of the following symptoms, and all other reviewed systems are negative.  Headache Ringing in the ears  Blood pressure 105/68, pulse 66, height 5' 3.5" (1.613 m), weight 126 lb 5 oz (57.3 kg), last menstrual period 10/25/2008.  Physical Exam  General: The patient is alert and cooperative at the time of the examination.  Eyes: Pupils are equal, round, and reactive to light. Discs are flat bilaterally.  Neck:  The neck is supple, no carotid bruits are noted.  Respiratory: The respiratory examination is clear.  Cardiovascular: The cardiovascular examination reveals a regular rate and rhythm, no obvious murmurs or rubs are noted.  Skin: Extremities are without significant edema.  Neurologic Exam  Mental status: The patient is alert and oriented x 3 at the time of the examination. The patient has apparent normal recent and remote memory, with an apparently normal attention span and concentration ability.  Cranial nerves: Facial symmetry is present. There is good sensation of the face to pinprick and soft touch bilaterally. The strength of the facial muscles and the muscles to head turning and shoulder shrug are normal bilaterally. Speech is well enunciated, no aphasia or dysarthria is noted. Extraocular movements are full. Visual fields are full. The tongue is midline, and the patient has symmetric elevation of the soft palate. No obvious hearing deficits are noted.  Motor: The motor testing reveals 5 over 5 strength of all 4 extremities. Good symmetric motor tone is noted throughout.  Sensory: Sensory testing is intact to pinprick, soft touch, vibration sensation, and position sense on all 4 extremities. No evidence of extinction is noted.  Coordination: Cerebellar testing reveals good finger-nose-finger and heel-to-shin bilaterally.  Gait and station: Gait is normal. Tandem gait is normal. Romberg is negative. No drift is seen.  Reflexes: Deep tendon reflexes are symmetric and normal bilaterally. Toes are downgoing bilaterally.   Assessment/Plan:  1.  Family history of cerebrovascular disease, intracranial hemorrhage  2.  Headache  The patient will be sent for MRI of the brain at this time.  She will have MRA of the head, she will have a hypercoagulable state work-up given the family history of intracranial hemorrhage and cerebrovascular disease.  I will contact her concerning the results of  the above.   Marlan Palau MD 06/29/2018 8:33 AM  [facility] [address] [place], [place] [address]  Phone [phone] Fax [phone]

## 2018-06-30 ENCOUNTER — Telehealth: Payer: Self-pay | Admitting: Neurology

## 2018-06-30 NOTE — Telephone Encounter (Signed)
lvm for pt to call back about scheduling mri  [employer] Auth: [id number] (exp. 06/30/18 to 07/29/18)

## 2018-06-30 NOTE — Telephone Encounter (Signed)
lvm for pt to call back.  

## 2018-06-30 NOTE — Telephone Encounter (Signed)
Patient returned my call and she stated [employer] called her and she is going to give them a call back to see what they wanted. I informed her that the insurance was probably telling her a place where she needs to have her MRI at. She stated she is going to give me a call back after she speaks to them and to let me know if she wants to have her MRI here or some where else.

## 2018-06-30 NOTE — Telephone Encounter (Signed)
Pt returned Emily's call °

## 2018-07-04 NOTE — Telephone Encounter (Signed)
Patient called and would like her MRI here she is scheduled for 07/05/18 at [facility].

## 2018-07-05 ENCOUNTER — Ambulatory Visit: Payer: BLUE CROSS/BLUE SHIELD

## 2018-07-05 DIAGNOSIS — G4489 Other headache syndrome: Secondary | ICD-10-CM | POA: Diagnosis not present

## 2018-07-05 DIAGNOSIS — Z823 Family history of stroke: Secondary | ICD-10-CM

## 2018-07-06 ENCOUNTER — Telehealth: Payer: Self-pay | Admitting: Neurology

## 2018-07-06 NOTE — Telephone Encounter (Signed)
  I called the patient.  MRI of the brain and MRA of the head is normal.  So far, blood work is completely normal for hypercoagulable state, we will contact her once all the blood studies are in.  MRI brain 07/06/18:  IMPRESSION: Unremarkable MRI scan of the brain without contrast.   MRA head 07/06/18:  IMPRESSION: Unremarkable MRA of the brain showing no significant stenosis of the large and medium size intracranial vessels.

## 2018-07-11 DIAGNOSIS — J3089 Other allergic rhinitis: Secondary | ICD-10-CM | POA: Diagnosis not present

## 2018-07-13 ENCOUNTER — Other Ambulatory Visit: Payer: Self-pay | Admitting: Obstetrics & Gynecology

## 2018-07-13 ENCOUNTER — Telehealth: Payer: Self-pay | Admitting: Neurology

## 2018-07-13 DIAGNOSIS — E7212 Methylenetetrahydrofolate reductase deficiency: Principal | ICD-10-CM

## 2018-07-13 DIAGNOSIS — E7211 Homocystinuria: Secondary | ICD-10-CM

## 2018-07-13 LAB — LUPUS ANTICOAGULANT
DPT CONFIRM RATIO: 0.92 ratio (ref 0.00–1.40)
Dilute Viper Venom Time: 28.3 s (ref 0.0–47.0)
PTT Lupus Anticoagulant: 28.4 s (ref 0.0–51.9)
THROMBIN TIME: 19.8 s (ref 0.0–23.0)
dPT: 34.8 s (ref 0.0–55.0)

## 2018-07-13 LAB — PROTEIN S PANEL
Protein S Activity: 124 % (ref 63–140)
Protein S Ag, Free: 148 % (ref 57–157)
Protein S Ag, Total: 89 % (ref 60–150)

## 2018-07-13 LAB — METHYLMALONIC ACID, SERUM: Methylmalonic Acid: 134 nmol/L (ref 0–378)

## 2018-07-13 LAB — FACTOR 5 LEIDEN

## 2018-07-13 LAB — MTHFR DNA ANALYSIS

## 2018-07-13 LAB — PROTEIN C ACTIVITY: Protein C Activity: 186 % — ABNORMAL HIGH (ref 73–180)

## 2018-07-13 NOTE — Telephone Encounter (Signed)
I called the patient.  The patient has a methylenetetrahydrofolate reductase mutation, I will check a fasting homocystine level, if this is elevated, she will need to be treated with vitamin tablets containing folate, B6, and B12 vitamins.

## 2018-07-14 ENCOUNTER — Other Ambulatory Visit: Payer: Self-pay | Admitting: Obstetrics & Gynecology

## 2018-07-14 NOTE — Telephone Encounter (Signed)
Patient is calling regarding prescription refill. °

## 2018-07-14 NOTE — Telephone Encounter (Signed)
Medication refill request: macrobid 100mg  Last AEX:  05-06-2018 Next AEX: 09-08-2019 Last MMG (if hormonal medication request): n/a Refill authorized: It looks like 12-29-16 was the last time it was given post coital. No rx was given at [facility] 04/2018. Patient states she forgot to ask for it. Please approve if appropriate. Patient is aware rx is being sent to provider.

## 2018-07-15 MED ORDER — NITROFURANTOIN MONOHYD MACRO 100 MG PO CAPS: ORAL_CAPSULE | ORAL | 1 refills | Status: DC

## 2018-07-26 DIAGNOSIS — J3081 Allergic rhinitis due to animal (cat) (dog) hair and dander: Secondary | ICD-10-CM | POA: Diagnosis not present

## 2018-07-26 DIAGNOSIS — J3089 Other allergic rhinitis: Secondary | ICD-10-CM | POA: Diagnosis not present

## 2018-07-26 DIAGNOSIS — J301 Allergic rhinitis due to pollen: Secondary | ICD-10-CM | POA: Diagnosis not present

## 2018-08-04 DIAGNOSIS — J3081 Allergic rhinitis due to animal (cat) (dog) hair and dander: Secondary | ICD-10-CM | POA: Diagnosis not present

## 2018-08-04 DIAGNOSIS — J3089 Other allergic rhinitis: Secondary | ICD-10-CM | POA: Diagnosis not present

## 2018-08-04 DIAGNOSIS — J301 Allergic rhinitis due to pollen: Secondary | ICD-10-CM | POA: Diagnosis not present

## 2018-08-10 DIAGNOSIS — J301 Allergic rhinitis due to pollen: Secondary | ICD-10-CM | POA: Diagnosis not present

## 2018-08-10 DIAGNOSIS — J3081 Allergic rhinitis due to animal (cat) (dog) hair and dander: Secondary | ICD-10-CM | POA: Diagnosis not present

## 2018-08-10 DIAGNOSIS — J3089 Other allergic rhinitis: Secondary | ICD-10-CM | POA: Diagnosis not present

## 2018-08-17 DIAGNOSIS — J3081 Allergic rhinitis due to animal (cat) (dog) hair and dander: Secondary | ICD-10-CM | POA: Diagnosis not present

## 2018-08-17 DIAGNOSIS — J3089 Other allergic rhinitis: Secondary | ICD-10-CM | POA: Diagnosis not present

## 2018-08-17 DIAGNOSIS — J301 Allergic rhinitis due to pollen: Secondary | ICD-10-CM | POA: Diagnosis not present

## 2018-08-24 DIAGNOSIS — J3081 Allergic rhinitis due to animal (cat) (dog) hair and dander: Secondary | ICD-10-CM | POA: Diagnosis not present

## 2018-08-24 DIAGNOSIS — J301 Allergic rhinitis due to pollen: Secondary | ICD-10-CM | POA: Diagnosis not present

## 2018-08-24 DIAGNOSIS — J3089 Other allergic rhinitis: Secondary | ICD-10-CM | POA: Diagnosis not present

## 2018-08-31 DIAGNOSIS — J3089 Other allergic rhinitis: Secondary | ICD-10-CM | POA: Diagnosis not present

## 2018-08-31 DIAGNOSIS — J3081 Allergic rhinitis due to animal (cat) (dog) hair and dander: Secondary | ICD-10-CM | POA: Diagnosis not present

## 2018-08-31 DIAGNOSIS — J301 Allergic rhinitis due to pollen: Secondary | ICD-10-CM | POA: Diagnosis not present

## 2018-09-07 DIAGNOSIS — J3081 Allergic rhinitis due to animal (cat) (dog) hair and dander: Secondary | ICD-10-CM | POA: Diagnosis not present

## 2018-09-07 DIAGNOSIS — J3089 Other allergic rhinitis: Secondary | ICD-10-CM | POA: Diagnosis not present

## 2018-09-07 DIAGNOSIS — J301 Allergic rhinitis due to pollen: Secondary | ICD-10-CM | POA: Diagnosis not present

## 2018-09-14 DIAGNOSIS — J301 Allergic rhinitis due to pollen: Secondary | ICD-10-CM | POA: Diagnosis not present

## 2018-09-14 DIAGNOSIS — J3089 Other allergic rhinitis: Secondary | ICD-10-CM | POA: Diagnosis not present

## 2018-09-14 DIAGNOSIS — J3081 Allergic rhinitis due to animal (cat) (dog) hair and dander: Secondary | ICD-10-CM | POA: Diagnosis not present

## 2018-09-21 DIAGNOSIS — J3089 Other allergic rhinitis: Secondary | ICD-10-CM | POA: Diagnosis not present

## 2018-09-21 DIAGNOSIS — J3081 Allergic rhinitis due to animal (cat) (dog) hair and dander: Secondary | ICD-10-CM | POA: Diagnosis not present

## 2018-09-21 DIAGNOSIS — J301 Allergic rhinitis due to pollen: Secondary | ICD-10-CM | POA: Diagnosis not present

## 2018-09-22 DIAGNOSIS — J3081 Allergic rhinitis due to animal (cat) (dog) hair and dander: Secondary | ICD-10-CM | POA: Diagnosis not present

## 2018-09-22 DIAGNOSIS — J301 Allergic rhinitis due to pollen: Secondary | ICD-10-CM | POA: Diagnosis not present

## 2018-10-03 DIAGNOSIS — J3081 Allergic rhinitis due to animal (cat) (dog) hair and dander: Secondary | ICD-10-CM | POA: Diagnosis not present

## 2018-10-03 DIAGNOSIS — J301 Allergic rhinitis due to pollen: Secondary | ICD-10-CM | POA: Diagnosis not present

## 2018-10-03 DIAGNOSIS — J3089 Other allergic rhinitis: Secondary | ICD-10-CM | POA: Diagnosis not present

## 2018-10-12 DIAGNOSIS — J3089 Other allergic rhinitis: Secondary | ICD-10-CM | POA: Diagnosis not present

## 2018-10-12 DIAGNOSIS — J3081 Allergic rhinitis due to animal (cat) (dog) hair and dander: Secondary | ICD-10-CM | POA: Diagnosis not present

## 2018-10-12 DIAGNOSIS — J301 Allergic rhinitis due to pollen: Secondary | ICD-10-CM | POA: Diagnosis not present

## 2018-10-20 DIAGNOSIS — J3081 Allergic rhinitis due to animal (cat) (dog) hair and dander: Secondary | ICD-10-CM | POA: Diagnosis not present

## 2018-10-20 DIAGNOSIS — J3089 Other allergic rhinitis: Secondary | ICD-10-CM | POA: Diagnosis not present

## 2018-10-20 DIAGNOSIS — J301 Allergic rhinitis due to pollen: Secondary | ICD-10-CM | POA: Diagnosis not present

## 2018-10-25 DIAGNOSIS — J301 Allergic rhinitis due to pollen: Secondary | ICD-10-CM | POA: Diagnosis not present

## 2018-10-25 DIAGNOSIS — Z91018 Allergy to other foods: Secondary | ICD-10-CM | POA: Diagnosis not present

## 2018-10-25 DIAGNOSIS — Z91010 Allergy to peanuts: Secondary | ICD-10-CM | POA: Diagnosis not present

## 2018-10-25 DIAGNOSIS — J3089 Other allergic rhinitis: Secondary | ICD-10-CM | POA: Diagnosis not present

## 2018-10-26 DIAGNOSIS — J301 Allergic rhinitis due to pollen: Secondary | ICD-10-CM | POA: Diagnosis not present

## 2018-10-26 DIAGNOSIS — J3081 Allergic rhinitis due to animal (cat) (dog) hair and dander: Secondary | ICD-10-CM | POA: Diagnosis not present

## 2018-10-26 DIAGNOSIS — J3089 Other allergic rhinitis: Secondary | ICD-10-CM | POA: Diagnosis not present

## 2018-11-03 ENCOUNTER — Encounter: Payer: Self-pay | Admitting: Obstetrics & Gynecology

## 2018-11-03 DIAGNOSIS — J3081 Allergic rhinitis due to animal (cat) (dog) hair and dander: Secondary | ICD-10-CM | POA: Diagnosis not present

## 2018-11-03 DIAGNOSIS — M810 Age-related osteoporosis without current pathological fracture: Secondary | ICD-10-CM | POA: Diagnosis not present

## 2018-11-03 DIAGNOSIS — Z780 Asymptomatic menopausal state: Secondary | ICD-10-CM | POA: Diagnosis not present

## 2018-11-03 DIAGNOSIS — Z1231 Encounter for screening mammogram for malignant neoplasm of breast: Secondary | ICD-10-CM | POA: Diagnosis not present

## 2018-11-03 DIAGNOSIS — J301 Allergic rhinitis due to pollen: Secondary | ICD-10-CM | POA: Diagnosis not present

## 2018-11-03 DIAGNOSIS — J3089 Other allergic rhinitis: Secondary | ICD-10-CM | POA: Diagnosis not present

## 2018-11-03 DIAGNOSIS — Z8262 Family history of osteoporosis: Secondary | ICD-10-CM | POA: Diagnosis not present

## 2018-11-10 DIAGNOSIS — J3089 Other allergic rhinitis: Secondary | ICD-10-CM | POA: Diagnosis not present

## 2018-11-10 DIAGNOSIS — J301 Allergic rhinitis due to pollen: Secondary | ICD-10-CM | POA: Diagnosis not present

## 2018-11-10 DIAGNOSIS — J3081 Allergic rhinitis due to animal (cat) (dog) hair and dander: Secondary | ICD-10-CM | POA: Diagnosis not present

## 2018-11-16 DIAGNOSIS — J3081 Allergic rhinitis due to animal (cat) (dog) hair and dander: Secondary | ICD-10-CM | POA: Diagnosis not present

## 2018-11-16 DIAGNOSIS — J3089 Other allergic rhinitis: Secondary | ICD-10-CM | POA: Diagnosis not present

## 2018-11-16 DIAGNOSIS — J301 Allergic rhinitis due to pollen: Secondary | ICD-10-CM | POA: Diagnosis not present

## 2018-11-30 DIAGNOSIS — J3081 Allergic rhinitis due to animal (cat) (dog) hair and dander: Secondary | ICD-10-CM | POA: Diagnosis not present

## 2018-11-30 DIAGNOSIS — J3089 Other allergic rhinitis: Secondary | ICD-10-CM | POA: Diagnosis not present

## 2018-11-30 DIAGNOSIS — J301 Allergic rhinitis due to pollen: Secondary | ICD-10-CM | POA: Diagnosis not present

## 2018-12-07 DIAGNOSIS — J3081 Allergic rhinitis due to animal (cat) (dog) hair and dander: Secondary | ICD-10-CM | POA: Diagnosis not present

## 2018-12-07 DIAGNOSIS — J3089 Other allergic rhinitis: Secondary | ICD-10-CM | POA: Diagnosis not present

## 2018-12-07 DIAGNOSIS — J301 Allergic rhinitis due to pollen: Secondary | ICD-10-CM | POA: Diagnosis not present

## 2018-12-13 DIAGNOSIS — J301 Allergic rhinitis due to pollen: Secondary | ICD-10-CM | POA: Diagnosis not present

## 2018-12-13 DIAGNOSIS — J3081 Allergic rhinitis due to animal (cat) (dog) hair and dander: Secondary | ICD-10-CM | POA: Diagnosis not present

## 2018-12-13 DIAGNOSIS — J3089 Other allergic rhinitis: Secondary | ICD-10-CM | POA: Diagnosis not present

## 2018-12-20 DIAGNOSIS — J3081 Allergic rhinitis due to animal (cat) (dog) hair and dander: Secondary | ICD-10-CM | POA: Diagnosis not present

## 2018-12-20 DIAGNOSIS — J301 Allergic rhinitis due to pollen: Secondary | ICD-10-CM | POA: Diagnosis not present

## 2018-12-20 DIAGNOSIS — J3089 Other allergic rhinitis: Secondary | ICD-10-CM | POA: Diagnosis not present

## 2018-12-27 DIAGNOSIS — J301 Allergic rhinitis due to pollen: Secondary | ICD-10-CM | POA: Diagnosis not present

## 2018-12-27 DIAGNOSIS — J3089 Other allergic rhinitis: Secondary | ICD-10-CM | POA: Diagnosis not present

## 2018-12-27 DIAGNOSIS — J3081 Allergic rhinitis due to animal (cat) (dog) hair and dander: Secondary | ICD-10-CM | POA: Diagnosis not present

## 2019-01-03 DIAGNOSIS — J3089 Other allergic rhinitis: Secondary | ICD-10-CM | POA: Diagnosis not present

## 2019-01-03 DIAGNOSIS — J301 Allergic rhinitis due to pollen: Secondary | ICD-10-CM | POA: Diagnosis not present

## 2019-01-03 DIAGNOSIS — J3081 Allergic rhinitis due to animal (cat) (dog) hair and dander: Secondary | ICD-10-CM | POA: Diagnosis not present

## 2019-01-10 DIAGNOSIS — J3081 Allergic rhinitis due to animal (cat) (dog) hair and dander: Secondary | ICD-10-CM | POA: Diagnosis not present

## 2019-01-10 DIAGNOSIS — J3089 Other allergic rhinitis: Secondary | ICD-10-CM | POA: Diagnosis not present

## 2019-01-10 DIAGNOSIS — J301 Allergic rhinitis due to pollen: Secondary | ICD-10-CM | POA: Diagnosis not present

## 2019-01-11 DIAGNOSIS — Z86018 Personal history of other benign neoplasm: Secondary | ICD-10-CM | POA: Diagnosis not present

## 2019-01-11 DIAGNOSIS — L820 Inflamed seborrheic keratosis: Secondary | ICD-10-CM | POA: Diagnosis not present

## 2019-01-11 DIAGNOSIS — Z808 Family history of malignant neoplasm of other organs or systems: Secondary | ICD-10-CM | POA: Diagnosis not present

## 2019-01-11 DIAGNOSIS — L821 Other seborrheic keratosis: Secondary | ICD-10-CM | POA: Diagnosis not present

## 2019-01-11 DIAGNOSIS — L814 Other melanin hyperpigmentation: Secondary | ICD-10-CM | POA: Diagnosis not present

## 2019-01-17 DIAGNOSIS — J3081 Allergic rhinitis due to animal (cat) (dog) hair and dander: Secondary | ICD-10-CM | POA: Diagnosis not present

## 2019-01-17 DIAGNOSIS — J3089 Other allergic rhinitis: Secondary | ICD-10-CM | POA: Diagnosis not present

## 2019-01-17 DIAGNOSIS — J301 Allergic rhinitis due to pollen: Secondary | ICD-10-CM | POA: Diagnosis not present

## 2019-01-20 DIAGNOSIS — Z20828 Contact with and (suspected) exposure to other viral communicable diseases: Secondary | ICD-10-CM | POA: Diagnosis not present

## 2019-01-25 DIAGNOSIS — J3089 Other allergic rhinitis: Secondary | ICD-10-CM | POA: Diagnosis not present

## 2019-01-25 DIAGNOSIS — J3081 Allergic rhinitis due to animal (cat) (dog) hair and dander: Secondary | ICD-10-CM | POA: Diagnosis not present

## 2019-01-25 DIAGNOSIS — J301 Allergic rhinitis due to pollen: Secondary | ICD-10-CM | POA: Diagnosis not present

## 2019-01-26 DIAGNOSIS — Z20828 Contact with and (suspected) exposure to other viral communicable diseases: Secondary | ICD-10-CM | POA: Diagnosis not present

## 2019-01-31 DIAGNOSIS — J3089 Other allergic rhinitis: Secondary | ICD-10-CM | POA: Diagnosis not present

## 2019-01-31 DIAGNOSIS — J301 Allergic rhinitis due to pollen: Secondary | ICD-10-CM | POA: Diagnosis not present

## 2019-01-31 DIAGNOSIS — J3081 Allergic rhinitis due to animal (cat) (dog) hair and dander: Secondary | ICD-10-CM | POA: Diagnosis not present

## 2019-02-08 DIAGNOSIS — J3081 Allergic rhinitis due to animal (cat) (dog) hair and dander: Secondary | ICD-10-CM | POA: Diagnosis not present

## 2019-02-08 DIAGNOSIS — J301 Allergic rhinitis due to pollen: Secondary | ICD-10-CM | POA: Diagnosis not present

## 2019-02-08 DIAGNOSIS — J3089 Other allergic rhinitis: Secondary | ICD-10-CM | POA: Diagnosis not present

## 2019-02-15 DIAGNOSIS — J3089 Other allergic rhinitis: Secondary | ICD-10-CM | POA: Diagnosis not present

## 2019-02-15 DIAGNOSIS — J301 Allergic rhinitis due to pollen: Secondary | ICD-10-CM | POA: Diagnosis not present

## 2019-02-15 DIAGNOSIS — J3081 Allergic rhinitis due to animal (cat) (dog) hair and dander: Secondary | ICD-10-CM | POA: Diagnosis not present

## 2019-02-21 DIAGNOSIS — J301 Allergic rhinitis due to pollen: Secondary | ICD-10-CM | POA: Diagnosis not present

## 2019-02-21 DIAGNOSIS — J3081 Allergic rhinitis due to animal (cat) (dog) hair and dander: Secondary | ICD-10-CM | POA: Diagnosis not present

## 2019-02-21 DIAGNOSIS — J3089 Other allergic rhinitis: Secondary | ICD-10-CM | POA: Diagnosis not present

## 2019-03-01 DIAGNOSIS — J3081 Allergic rhinitis due to animal (cat) (dog) hair and dander: Secondary | ICD-10-CM | POA: Diagnosis not present

## 2019-03-01 DIAGNOSIS — J3089 Other allergic rhinitis: Secondary | ICD-10-CM | POA: Diagnosis not present

## 2019-03-01 DIAGNOSIS — J301 Allergic rhinitis due to pollen: Secondary | ICD-10-CM | POA: Diagnosis not present

## 2019-03-07 DIAGNOSIS — J3089 Other allergic rhinitis: Secondary | ICD-10-CM | POA: Diagnosis not present

## 2019-03-07 DIAGNOSIS — J301 Allergic rhinitis due to pollen: Secondary | ICD-10-CM | POA: Diagnosis not present

## 2019-03-07 DIAGNOSIS — J3081 Allergic rhinitis due to animal (cat) (dog) hair and dander: Secondary | ICD-10-CM | POA: Diagnosis not present

## 2019-03-15 DIAGNOSIS — J301 Allergic rhinitis due to pollen: Secondary | ICD-10-CM | POA: Diagnosis not present

## 2019-03-15 DIAGNOSIS — J3081 Allergic rhinitis due to animal (cat) (dog) hair and dander: Secondary | ICD-10-CM | POA: Diagnosis not present

## 2019-03-15 DIAGNOSIS — J3089 Other allergic rhinitis: Secondary | ICD-10-CM | POA: Diagnosis not present

## 2019-03-28 DIAGNOSIS — J3089 Other allergic rhinitis: Secondary | ICD-10-CM | POA: Diagnosis not present

## 2019-03-28 DIAGNOSIS — J3081 Allergic rhinitis due to animal (cat) (dog) hair and dander: Secondary | ICD-10-CM | POA: Diagnosis not present

## 2019-03-28 DIAGNOSIS — J301 Allergic rhinitis due to pollen: Secondary | ICD-10-CM | POA: Diagnosis not present

## 2019-04-06 DIAGNOSIS — J301 Allergic rhinitis due to pollen: Secondary | ICD-10-CM | POA: Diagnosis not present

## 2019-04-06 DIAGNOSIS — J3081 Allergic rhinitis due to animal (cat) (dog) hair and dander: Secondary | ICD-10-CM | POA: Diagnosis not present

## 2019-04-06 DIAGNOSIS — J3089 Other allergic rhinitis: Secondary | ICD-10-CM | POA: Diagnosis not present

## 2019-04-14 DIAGNOSIS — J3089 Other allergic rhinitis: Secondary | ICD-10-CM | POA: Diagnosis not present

## 2019-04-14 DIAGNOSIS — J301 Allergic rhinitis due to pollen: Secondary | ICD-10-CM | POA: Diagnosis not present

## 2019-04-14 DIAGNOSIS — J3081 Allergic rhinitis due to animal (cat) (dog) hair and dander: Secondary | ICD-10-CM | POA: Diagnosis not present

## 2019-04-25 DIAGNOSIS — J3089 Other allergic rhinitis: Secondary | ICD-10-CM | POA: Diagnosis not present

## 2019-04-25 DIAGNOSIS — J3081 Allergic rhinitis due to animal (cat) (dog) hair and dander: Secondary | ICD-10-CM | POA: Diagnosis not present

## 2019-04-25 DIAGNOSIS — J301 Allergic rhinitis due to pollen: Secondary | ICD-10-CM | POA: Diagnosis not present

## 2019-05-02 DIAGNOSIS — J301 Allergic rhinitis due to pollen: Secondary | ICD-10-CM | POA: Diagnosis not present

## 2019-05-02 DIAGNOSIS — J3089 Other allergic rhinitis: Secondary | ICD-10-CM | POA: Diagnosis not present

## 2019-05-16 DIAGNOSIS — J3089 Other allergic rhinitis: Secondary | ICD-10-CM | POA: Diagnosis not present

## 2019-05-16 DIAGNOSIS — J301 Allergic rhinitis due to pollen: Secondary | ICD-10-CM | POA: Diagnosis not present

## 2019-05-16 DIAGNOSIS — J3081 Allergic rhinitis due to animal (cat) (dog) hair and dander: Secondary | ICD-10-CM | POA: Diagnosis not present

## 2019-05-23 DIAGNOSIS — J3089 Other allergic rhinitis: Secondary | ICD-10-CM | POA: Diagnosis not present

## 2019-05-23 DIAGNOSIS — J301 Allergic rhinitis due to pollen: Secondary | ICD-10-CM | POA: Diagnosis not present

## 2019-05-23 DIAGNOSIS — J3081 Allergic rhinitis due to animal (cat) (dog) hair and dander: Secondary | ICD-10-CM | POA: Diagnosis not present

## 2019-05-31 DIAGNOSIS — J3081 Allergic rhinitis due to animal (cat) (dog) hair and dander: Secondary | ICD-10-CM | POA: Diagnosis not present

## 2019-05-31 DIAGNOSIS — J301 Allergic rhinitis due to pollen: Secondary | ICD-10-CM | POA: Diagnosis not present

## 2019-05-31 DIAGNOSIS — J3089 Other allergic rhinitis: Secondary | ICD-10-CM | POA: Diagnosis not present

## 2019-06-08 DIAGNOSIS — J3081 Allergic rhinitis due to animal (cat) (dog) hair and dander: Secondary | ICD-10-CM | POA: Diagnosis not present

## 2019-06-08 DIAGNOSIS — J3089 Other allergic rhinitis: Secondary | ICD-10-CM | POA: Diagnosis not present

## 2019-06-08 DIAGNOSIS — J301 Allergic rhinitis due to pollen: Secondary | ICD-10-CM | POA: Diagnosis not present

## 2019-06-12 ENCOUNTER — Other Ambulatory Visit: Payer: Self-pay

## 2019-06-13 ENCOUNTER — Ambulatory Visit: Payer: BC Managed Care – PPO | Admitting: Obstetrics & Gynecology

## 2019-06-21 DIAGNOSIS — J3081 Allergic rhinitis due to animal (cat) (dog) hair and dander: Secondary | ICD-10-CM | POA: Diagnosis not present

## 2019-06-21 DIAGNOSIS — J301 Allergic rhinitis due to pollen: Secondary | ICD-10-CM | POA: Diagnosis not present

## 2019-06-21 DIAGNOSIS — J3089 Other allergic rhinitis: Secondary | ICD-10-CM | POA: Diagnosis not present

## 2019-06-26 ENCOUNTER — Ambulatory Visit (INDEPENDENT_AMBULATORY_CARE_PROVIDER_SITE_OTHER): Payer: BC Managed Care – PPO | Admitting: Obstetrics & Gynecology

## 2019-06-26 ENCOUNTER — Encounter: Payer: Self-pay | Admitting: Obstetrics & Gynecology

## 2019-06-26 ENCOUNTER — Other Ambulatory Visit: Payer: Self-pay

## 2019-06-26 ENCOUNTER — Other Ambulatory Visit (HOSPITAL_COMMUNITY)
Admission: RE | Admit: 2019-06-26 | Discharge: 2019-06-26 | Disposition: A | Discharge status: Home / Self Care | Payer: BC Managed Care – PPO | Source: Ambulatory Visit | Attending: Obstetrics & Gynecology | Admitting: Obstetrics & Gynecology

## 2019-06-26 VITALS — BP 108/70 | HR 72 | Temp 98.2°F | Resp 12 | Ht 63.75 in | Wt 128.0 lb

## 2019-06-26 DIAGNOSIS — Z01419 Encounter for gynecological examination (general) (routine) without abnormal findings: Secondary | ICD-10-CM

## 2019-06-26 DIAGNOSIS — Z124 Encounter for screening for malignant neoplasm of cervix: Secondary | ICD-10-CM | POA: Insufficient documentation

## 2019-06-26 DIAGNOSIS — J3081 Allergic rhinitis due to animal (cat) (dog) hair and dander: Secondary | ICD-10-CM | POA: Diagnosis not present

## 2019-06-26 DIAGNOSIS — J301 Allergic rhinitis due to pollen: Secondary | ICD-10-CM | POA: Diagnosis not present

## 2019-06-26 DIAGNOSIS — Z1589 Genetic susceptibility to other disease: Secondary | ICD-10-CM

## 2019-06-26 DIAGNOSIS — J3089 Other allergic rhinitis: Secondary | ICD-10-CM | POA: Diagnosis not present

## 2019-06-26 DIAGNOSIS — E7212 Methylenetetrahydrofolate reductase deficiency: Secondary | ICD-10-CM

## 2019-06-26 NOTE — Progress Notes (Signed)
59 y.o. G23P3003 Divorced White or Caucasian female here for annual exam.  Two of her children and families got Covid but both did well.  Relationship with significant other is good except for his daughter.  This causes stressors.   Denies vaginal bleeding.     Has a specific question for me.  Reports since last summer, when she goes to bed she notices her hands are sticky.  Does have a thyroid nodule that does not need removal.  She did have an MRI/MRA done with him.  Has MTHFR homozygous gene mutation.  Needs homocysteine level done. This was about a year ago when Covid really changed.  She needs to have this done.   BMD in July d/w pt.  She cannot remember if she was called with results.  She is on calcium and vit D.  Exercise discussed.  She would prefer not to start medications at this time.  Will plan to recheck.    Has blood work done with [facility] for annual lab work.  Had this done in 2020.  This was all normal.  Patient's last menstrual period was 10/25/2008.          Sexually active: Yes.    The current method of family planning is ablation.    Exercising: Yes.    walking and pilates Smoker:  no  Health Maintenance: Pap:  05/06/18 Neg  09/09/15 Neg:Neg HR HPV History of abnormal Pap:  no MMG:  11/03/18 BIRADS 1 negative/density c Colonoscopy:  2009 Normal.  She is aware this is due.   BMD:   11/03/18 Osteoporosis TDaP:  2018 Pneumonia vaccine(s):  n/a Shingrix:   Completed Hep C testing: donated blood in the past Screening Labs: PCP   reports that she has never smoked. She has never used smokeless tobacco. She reports current alcohol use of about 1.0 - 2.0 standard drinks of alcohol per week. She reports that she does not use drugs.  Past Medical History:  Diagnosis Date  . Family history of stroke (cerebrovascular) 06/29/2018  . Gestational diabetes   . Headache(784.0)    migraines  . HSV (herpes simplex virus) infection   . Shingles 4/15  . Thyroid nodule     Past Surgical  History:  Procedure Laterality Date  . COLONOSCOPY  12/09   normal w/Dr. Magod--next due 2019  . HYSTEROSCOPY WITH NOVASURE  10/2008  . TUBAL LIGATION  1993    Current Outpatient Medications  Medication Sig Dispense Refill  . aspirin (ASPIRIN LOW DOSE) 81 MG tablet Take 81 mg by mouth 1 day or 1 dose.    . B Complex Vitamins (VITAMIN B COMPLEX PO) Take by mouth daily.    Marland Kitchen CALCIUM PO Take by mouth daily.    . Docosahexaenoic Acid (DHA OMEGA 3 PO) Take by mouth daily.    Marland Kitchen MAGNESIUM PO Take by mouth daily.    . montelukast (SINGULAIR) 10 MG tablet Take 1 tablet by mouth daily.    . Multiple Vitamins-Minerals (MULTIVITAMIN PO) Take by mouth daily.    . nitrofurantoin, macrocrystal-monohydrate, (MACROBID) 100 MG capsule Take 1 po as directed 30 capsule 1  . potassium chloride SA (K-DUR,KLOR-CON) 20 MEQ tablet Take 20 mEq by mouth daily.    Marland Kitchen SALINE NASAL SPRAY NA Place into the nose as needed.    Marland Kitchen VITAMIN D, CHOLECALCIFEROL, PO Take by mouth daily.     No current facility-administered medications for this visit.    Family History  Problem Relation Age of  Onset  . Hypertension Father   . CVA Father   . Stroke Father   . Varicose Veins Mother   . Stroke Mother   . Stroke Brother        hemorrhagic     Review of Systems  All other systems reviewed and are negative.   Exam:   BP 108/70 (BP Location: Right Arm, Patient Position: Sitting, Cuff Size: Normal)   Pulse 72   Temp 98.2 F (36.8 C) (Temporal)   Resp 12   Ht 5' 3.75" (1.619 m)   Wt 128 lb (58.1 kg)   LMP 10/25/2008   BMI 22.14 kg/m     Height: 5' 3.75" (161.9 cm)  Ht Readings from Last 3 Encounters:  06/26/19 5' 3.75" (1.619 m)  06/29/18 5' 3.5" (1.613 m)  05/06/18 5' 3.5" (1.613 m)   General appearance: alert, cooperative and appears stated age Head: Normocephalic, without obvious abnormality, atraumatic Neck: no adenopathy, supple, symmetrical, trachea midline and thyroid normal to inspection and  palpation Lungs: clear to auscultation bilaterally Breasts: normal appearance, no masses or tenderness Heart: regular rate and rhythm Abdomen: soft, non-tender; bowel sounds normal; no masses,  no organomegaly Extremities: extremities normal, atraumatic, no cyanosis or edema Skin: Skin color, texture, turgor normal. No rashes or lesions Lymph nodes: Cervical, supraclavicular, and axillary nodes normal. No abnormal inguinal nodes palpated Neurologic: Grossly normal   Pelvic: External genitalia:  no lesions              Urethra:  normal appearing urethra with no masses, tenderness or lesions              Bartholins and Skenes: normal                 Vagina: normal appearing vagina with normal color and discharge, no lesions              Cervix: no lesions              Pap taken: Yes.   Bimanual Exam:  Uterus:  normal size, contour, position, consistency, mobility, non-tender              Adnexa: normal adnexa and no mass, fullness, tenderness               Rectovaginal: Confirms               Anus:  normal sphincter tone, no lesions  Chaperone, Terence Lux, CMA, was present for exam.  A:  Well Woman with normal exam PMP, no HRT H/o endometrial ablation 12/09 H/o thyroid nodule H/o MTHFT homozygous gene mutation, was supposed to have homocysteine level (saw neurology and had testing due to brother with hx of stroke) H/o recurrent UTIs (does not need Rx today)  P:   Mammogram guidelines reviewed pap smear with HR HPV obtained today Homocysteine level obtained today BMD discussed.  Calcium, Vit D, exercise discussed.  Will repeat 7/22.  if worse, will plan to start treatment Pt aware colonoscopy is due.  She is going to call to have this done Had blood work done through [facility] annually or prn

## 2019-06-27 DIAGNOSIS — J3089 Other allergic rhinitis: Secondary | ICD-10-CM | POA: Diagnosis not present

## 2019-06-27 LAB — CYTOLOGY - PAP
Comment: NEGATIVE
Diagnosis: NEGATIVE
High risk HPV: NEGATIVE

## 2019-06-27 LAB — HOMOCYSTEINE: Homocysteine: 8.8 umol/L (ref 0.0–14.5)

## 2019-06-29 ENCOUNTER — Encounter: Payer: Self-pay | Admitting: Obstetrics & Gynecology

## 2019-07-04 DIAGNOSIS — J301 Allergic rhinitis due to pollen: Secondary | ICD-10-CM | POA: Diagnosis not present

## 2019-07-04 DIAGNOSIS — J3081 Allergic rhinitis due to animal (cat) (dog) hair and dander: Secondary | ICD-10-CM | POA: Diagnosis not present

## 2019-07-04 DIAGNOSIS — J3089 Other allergic rhinitis: Secondary | ICD-10-CM | POA: Diagnosis not present

## 2019-07-12 DIAGNOSIS — J3089 Other allergic rhinitis: Secondary | ICD-10-CM | POA: Diagnosis not present

## 2019-07-12 DIAGNOSIS — J301 Allergic rhinitis due to pollen: Secondary | ICD-10-CM | POA: Diagnosis not present

## 2019-07-12 DIAGNOSIS — J3081 Allergic rhinitis due to animal (cat) (dog) hair and dander: Secondary | ICD-10-CM | POA: Diagnosis not present

## 2019-07-18 DIAGNOSIS — J3081 Allergic rhinitis due to animal (cat) (dog) hair and dander: Secondary | ICD-10-CM | POA: Diagnosis not present

## 2019-07-18 DIAGNOSIS — J301 Allergic rhinitis due to pollen: Secondary | ICD-10-CM | POA: Diagnosis not present

## 2019-07-18 DIAGNOSIS — J3089 Other allergic rhinitis: Secondary | ICD-10-CM | POA: Diagnosis not present

## 2019-08-04 DIAGNOSIS — J3081 Allergic rhinitis due to animal (cat) (dog) hair and dander: Secondary | ICD-10-CM | POA: Diagnosis not present

## 2019-08-04 DIAGNOSIS — J3089 Other allergic rhinitis: Secondary | ICD-10-CM | POA: Diagnosis not present

## 2019-08-04 DIAGNOSIS — J301 Allergic rhinitis due to pollen: Secondary | ICD-10-CM | POA: Diagnosis not present

## 2019-08-14 DIAGNOSIS — J3089 Other allergic rhinitis: Secondary | ICD-10-CM | POA: Diagnosis not present

## 2019-08-14 DIAGNOSIS — J3081 Allergic rhinitis due to animal (cat) (dog) hair and dander: Secondary | ICD-10-CM | POA: Diagnosis not present

## 2019-08-14 DIAGNOSIS — J301 Allergic rhinitis due to pollen: Secondary | ICD-10-CM | POA: Diagnosis not present

## 2019-08-30 DIAGNOSIS — J301 Allergic rhinitis due to pollen: Secondary | ICD-10-CM | POA: Diagnosis not present

## 2019-08-30 DIAGNOSIS — J3089 Other allergic rhinitis: Secondary | ICD-10-CM | POA: Diagnosis not present

## 2019-08-30 DIAGNOSIS — J3081 Allergic rhinitis due to animal (cat) (dog) hair and dander: Secondary | ICD-10-CM | POA: Diagnosis not present

## 2019-09-06 DIAGNOSIS — J3081 Allergic rhinitis due to animal (cat) (dog) hair and dander: Secondary | ICD-10-CM | POA: Diagnosis not present

## 2019-09-06 DIAGNOSIS — J3089 Other allergic rhinitis: Secondary | ICD-10-CM | POA: Diagnosis not present

## 2019-09-06 DIAGNOSIS — J301 Allergic rhinitis due to pollen: Secondary | ICD-10-CM | POA: Diagnosis not present

## 2019-09-08 ENCOUNTER — Ambulatory Visit: Payer: BLUE CROSS/BLUE SHIELD | Admitting: Obstetrics & Gynecology

## 2019-10-11 ENCOUNTER — Other Ambulatory Visit: Payer: Self-pay | Admitting: *Deleted

## 2019-10-11 MED ORDER — NITROFURANTOIN MONOHYD MACRO 100 MG PO CAPS: ORAL_CAPSULE | ORAL | 1 refills | Status: DC

## 2019-10-11 NOTE — Telephone Encounter (Signed)
Medication refill request: Macrobid  Last AEX:  06-26-19 SM  Next AEX: 09-20-20  Last MMG (if hormonal medication request): n/a Refill authorized: Today, please advise.   Medication pended for #30, 1RF. Please refill if appropriate.

## 2019-10-12 ENCOUNTER — Telehealth: Payer: Self-pay

## 2019-10-12 NOTE — Telephone Encounter (Signed)
Spoke with Lisette Abu at [facility] and gave instructions for pt to take Macrobid daily for UTI prevention. H/o recurrent UTIs. Rx for Macrobid was sent to pharmacy on 10/11/19 with #30, 1 RF.   Routing to Dr Hyacinth Meeker for review.  Encounter closed.

## 2019-10-12 NOTE — Telephone Encounter (Signed)
[facility] is calling in regards to medication directions for Nitrofurantoin.

## 2019-12-06 DIAGNOSIS — Z03818 Encounter for observation for suspected exposure to other biological agents ruled out: Secondary | ICD-10-CM | POA: Diagnosis not present

## 2019-12-06 DIAGNOSIS — Z1231 Encounter for screening mammogram for malignant neoplasm of breast: Secondary | ICD-10-CM | POA: Diagnosis not present

## 2019-12-06 DIAGNOSIS — Z20822 Contact with and (suspected) exposure to covid-19: Secondary | ICD-10-CM | POA: Diagnosis not present

## 2019-12-11 DIAGNOSIS — J309 Allergic rhinitis, unspecified: Secondary | ICD-10-CM | POA: Diagnosis not present

## 2020-01-17 DIAGNOSIS — L814 Other melanin hyperpigmentation: Secondary | ICD-10-CM | POA: Diagnosis not present

## 2020-01-17 DIAGNOSIS — L821 Other seborrheic keratosis: Secondary | ICD-10-CM | POA: Diagnosis not present

## 2020-01-17 DIAGNOSIS — Z808 Family history of malignant neoplasm of other organs or systems: Secondary | ICD-10-CM | POA: Diagnosis not present

## 2020-01-17 DIAGNOSIS — Z86018 Personal history of other benign neoplasm: Secondary | ICD-10-CM | POA: Diagnosis not present

## 2020-05-20 DIAGNOSIS — Z20828 Contact with and (suspected) exposure to other viral communicable diseases: Secondary | ICD-10-CM | POA: Diagnosis not present

## 2020-05-21 DIAGNOSIS — Z20828 Contact with and (suspected) exposure to other viral communicable diseases: Secondary | ICD-10-CM | POA: Diagnosis not present

## 2020-06-24 ENCOUNTER — Other Ambulatory Visit: Payer: Self-pay | Admitting: Obstetrics & Gynecology

## 2020-06-25 NOTE — Telephone Encounter (Signed)
I just did a refill request for this pt for her nitrofurantoin.  She needs an AEX scheduled.  Will you please call her?  Thanks.

## 2020-07-04 ENCOUNTER — Telehealth (HOSPITAL_BASED_OUTPATIENT_CLINIC_OR_DEPARTMENT_OTHER): Payer: Self-pay | Admitting: Obstetrics & Gynecology

## 2020-07-04 NOTE — Telephone Encounter (Signed)
Called patient and left a message to please call the office . 

## 2020-08-27 ENCOUNTER — Ambulatory Visit (HOSPITAL_BASED_OUTPATIENT_CLINIC_OR_DEPARTMENT_OTHER): Payer: BC Managed Care – PPO | Admitting: Obstetrics & Gynecology

## 2020-09-20 ENCOUNTER — Ambulatory Visit: Payer: BC Managed Care – PPO

## 2020-10-21 DIAGNOSIS — Z23 Encounter for immunization: Secondary | ICD-10-CM | POA: Diagnosis not present

## 2020-12-06 ENCOUNTER — Other Ambulatory Visit: Payer: Self-pay

## 2020-12-06 ENCOUNTER — Ambulatory Visit (HOSPITAL_BASED_OUTPATIENT_CLINIC_OR_DEPARTMENT_OTHER): Payer: BC Managed Care – PPO | Admitting: Obstetrics & Gynecology

## 2020-12-24 DIAGNOSIS — M810 Age-related osteoporosis without current pathological fracture: Secondary | ICD-10-CM | POA: Diagnosis not present

## 2020-12-24 DIAGNOSIS — Z1231 Encounter for screening mammogram for malignant neoplasm of breast: Secondary | ICD-10-CM | POA: Diagnosis not present

## 2020-12-24 DIAGNOSIS — M8589 Other specified disorders of bone density and structure, multiple sites: Secondary | ICD-10-CM | POA: Diagnosis not present

## 2020-12-25 ENCOUNTER — Encounter (HOSPITAL_BASED_OUTPATIENT_CLINIC_OR_DEPARTMENT_OTHER): Payer: Self-pay | Admitting: Obstetrics & Gynecology

## 2020-12-31 ENCOUNTER — Encounter (HOSPITAL_BASED_OUTPATIENT_CLINIC_OR_DEPARTMENT_OTHER): Payer: Self-pay | Admitting: Obstetrics & Gynecology

## 2021-01-01 ENCOUNTER — Other Ambulatory Visit: Payer: Self-pay

## 2021-01-01 ENCOUNTER — Encounter (HOSPITAL_BASED_OUTPATIENT_CLINIC_OR_DEPARTMENT_OTHER): Payer: Self-pay | Admitting: Obstetrics & Gynecology

## 2021-01-01 ENCOUNTER — Ambulatory Visit (INDEPENDENT_AMBULATORY_CARE_PROVIDER_SITE_OTHER): Payer: BC Managed Care – PPO | Admitting: Obstetrics & Gynecology

## 2021-01-01 VITALS — BP 110/73 | HR 65 | Ht 64.0 in | Wt 129.0 lb

## 2021-01-01 DIAGNOSIS — Z9889 Other specified postprocedural states: Secondary | ICD-10-CM

## 2021-01-01 DIAGNOSIS — M810 Age-related osteoporosis without current pathological fracture: Secondary | ICD-10-CM

## 2021-01-01 DIAGNOSIS — Z01419 Encounter for gynecological examination (general) (routine) without abnormal findings: Secondary | ICD-10-CM | POA: Diagnosis not present

## 2021-01-01 DIAGNOSIS — N390 Urinary tract infection, site not specified: Secondary | ICD-10-CM

## 2021-01-01 DIAGNOSIS — M25552 Pain in left hip: Secondary | ICD-10-CM

## 2021-01-01 DIAGNOSIS — Z780 Asymptomatic menopausal state: Secondary | ICD-10-CM

## 2021-01-01 DIAGNOSIS — Z1589 Genetic susceptibility to other disease: Secondary | ICD-10-CM

## 2021-01-01 NOTE — Progress Notes (Signed)
60 y.o. G8P3003 Divorced White or Caucasian female here for annual exam.  Doing well.  Just had bone density.  Had osteoporosis in her left hip with t score -2.7.  There is no prior comparison and other measurements were stable or improved.  I feel like this area in the hip could be stable as well and would plan repeat BMD in two years for recheck before starting any medication.  Pt reports having significant left help pain that is worse with positioning at night.  Is worse with activity.  Has not done an evaluation.  If on her feet all day, her left hip really just aches.    Patient's last menstrual period was 10/25/2008.          Sexually active: Yes.    The current method of family planning is post menopausal status.    Exercising: pt says not enough Smoker:  no  Health Maintenance: Pap:  06/26/2019 Negative History of abnormal Pap:  no MMG:  12/24/2020 Negative Colonoscopy:  2009.  Pt aware this is due.  Sees Dr. Loreta Ave. BMD:   12/24/2020 Osteoporosis TDaP:  2018 Shingrix:   completed Hep C testing: declines Screening Labs: has done with work   reports that she has never smoked. She has never used smokeless tobacco. She reports current alcohol use of about 1.0 - 2.0 standard drink per week. She reports that she does not use drugs.  Past Medical History:  Diagnosis Date   Family history of stroke (cerebrovascular) 06/29/2018   Gestational diabetes    Headache(784.0)    migraines   HSV (herpes simplex virus) infection    Shingles 4/15   Thyroid nodule     Past Surgical History:  Procedure Laterality Date   COLONOSCOPY  12/09   normal w/Dr. Magod--next due 2019   HYSTEROSCOPY WITH NOVASURE  10/2008   TUBAL LIGATION  1993    Current Outpatient Medications  Medication Sig Dispense Refill   B Complex Vitamins (VITAMIN B COMPLEX PO) Take by mouth daily.     CALCIUM PO Take by mouth daily.     Docosahexaenoic Acid (DHA OMEGA 3 PO) Take by mouth daily.     MAGNESIUM PO Take by  mouth daily.     montelukast (SINGULAIR) 10 MG tablet Take 1 tablet by mouth daily.     Multiple Vitamins-Minerals (MULTIVITAMIN PO) Take by mouth daily.     nitrofurantoin, macrocrystal-monohydrate, (MACROBID) 100 MG capsule TAKE ONE CAPSULE BY MOUTH EVERY DAY AS DIRECTED 30 capsule 1   potassium chloride SA (K-DUR,KLOR-CON) 20 MEQ tablet Take 20 mEq by mouth daily.     SALINE NASAL SPRAY NA Place into the nose as needed.     VITAMIN D, CHOLECALCIFEROL, PO Take by mouth daily.     aspirin (ASPIRIN LOW DOSE) 81 MG tablet Take 81 mg by mouth 1 day or 1 dose.     No current facility-administered medications for this visit.    Family History  Problem Relation Age of Onset   Hypertension Father    CVA Father    Stroke Father    Varicose Veins Mother    Stroke Mother    Stroke Brother        hemorrhagic     Review of Systems  All other systems reviewed and are negative.  Exam:   BP 110/73 (BP Location: Right Arm, Patient Position: Sitting, Cuff Size: Small)   Pulse 65   Ht 5\' 4"  (1.626 m)   Wt 129 lb (  58.5 kg)   LMP 10/25/2008   BMI 22.14 kg/m   Height: 5\' 4"  (162.6 cm)  General appearance: alert, cooperative and appears stated age Head: Normocephalic, without obvious abnormality, atraumatic Neck: no adenopathy, supple, symmetrical, trachea midline and thyroid normal to inspection and palpation Lungs: clear to auscultation bilaterally Breasts: normal appearance, no masses or tenderness Heart: regular rate and rhythm Abdomen: soft, non-tender; bowel sounds normal; no masses,  no organomegaly Extremities: extremities normal, atraumatic, no cyanosis or edema Skin: Skin color, texture, turgor normal. No rashes or lesions Lymph nodes: Cervical, supraclavicular, and axillary nodes normal. No abnormal inguinal nodes palpated Neurologic: Grossly normal   Pelvic: External genitalia:  no lesions              Urethra:  normal appearing urethra with no masses, tenderness or lesions               Bartholins and Skenes: normal                 Vagina: normal appearing vagina with normal color and no discharge, no lesions              Cervix: no lesions              Pap taken: No. Bimanual Exam:  Uterus:  normal size, contour, position, consistency, mobility, non-tender              Adnexa: normal adnexa and no mass, fullness, tenderness               Rectovaginal: Confirms               Anus:  normal sphincter tone, no lesions  Chaperone, , CMA, was present for exam.  Assessment/Plan: 1. Well woman exam with routine gynecological exam - pap neg with neg HR HPV 06/2019.  Not indicated today. - MMG 11/2020 - BMD 11/2020 - colonoscopy 2009.  Pt aware this is due.  Is going to see Dr. 2010 - vaccines reviewed/updated - recommended Hep C level today and screening blood work.  Pt reports has fingerstick done at work.  She really hates bloo work do declines today.  2. Left hip pain - AMB referral to sports medicine  3. Age-related osteoporosis without current pathological fracture - will recheck BMD in 2 years.  See discussion above  4. Postmenopausal - no HRT  5. S/P endometrial ablation - in 2012  6. Recurrent UTI - does not need rx for macrobid.  Was done earlier this year.  7. MTHFR gene mutation

## 2021-01-02 DIAGNOSIS — N390 Urinary tract infection, site not specified: Secondary | ICD-10-CM | POA: Insufficient documentation

## 2021-01-02 DIAGNOSIS — Z1589 Genetic susceptibility to other disease: Secondary | ICD-10-CM | POA: Insufficient documentation

## 2021-01-02 DIAGNOSIS — M25552 Pain in left hip: Secondary | ICD-10-CM | POA: Insufficient documentation

## 2021-01-02 DIAGNOSIS — Z9889 Other specified postprocedural states: Secondary | ICD-10-CM | POA: Insufficient documentation

## 2021-01-02 DIAGNOSIS — Z780 Asymptomatic menopausal state: Secondary | ICD-10-CM | POA: Insufficient documentation

## 2021-01-02 DIAGNOSIS — M810 Age-related osteoporosis without current pathological fracture: Secondary | ICD-10-CM | POA: Insufficient documentation

## 2021-01-09 NOTE — Progress Notes (Signed)
[name] [address] [place] [address] Phone: [phone] Subjective:   Bruce Donath, am serving as a scribe for Dr. Antoine Primas. This visit occurred during the SARS-CoV-2 public health emergency.  Safety protocols were in place, including screening questions prior to the visit, additional usage of staff PPE, and extensive cleaning of exam room while observing appropriate contact time as indicated for disinfecting solutions.   I'm seeing this patient by the request  of:  Joycelyn Rua, MD  CC: Left hip pain  [id number]  DAMIAN HOFSTRA is a 60 y.o. female coming in with complaint of left hip pain over anterior aspect. Patient has pain with lying on side. Does have osteoporosis in L hip. Pain increases with activity. Takes Aleve and IBU QHS for pain relief.   Patient also notes having R medial longitudinal arch after wearing wedges that she does not typically wear. Pain worse at night for past 10 days.       Past Medical History:  Diagnosis Date   Family history of stroke (cerebrovascular) 06/29/2018   Gestational diabetes    Headache(784.0)    migraines   HSV (herpes simplex virus) infection    Shingles 4/15   Thyroid nodule    Past Surgical History:  Procedure Laterality Date   COLONOSCOPY  12/09   normal w/Dr. Magod--next due 2019   HYSTEROSCOPY WITH NOVASURE  10/2008   TUBAL LIGATION  1993   Social History   Socioeconomic History   Marital status: Divorced    Spouse name: Not on file   Number of children: 3   Years of education: Not on file   Highest education level: Some college, no degree  Occupational History   Not on file  Tobacco Use   Smoking status: Never   Smokeless tobacco: Never  Vaping Use   Vaping Use: Never used  Substance and Sexual Activity   Alcohol use: Yes    Alcohol/week: 1.0 - 2.0 standard drink    Types: 1 - 2 Standard drinks or equivalent per week   Drug use: No   Sexual activity:  Yes    Partners: Male    Birth control/protection: Other-see comments, Post-menopausal    Comment: BTL and ablation  Other Topics Concern   Not on file  Social History Narrative   Right handed   3 cups per day (coffee)   Lives at home with significant other    Social Determinants of Health   Financial Resource Strain: Not on file  Food Insecurity: Not on file  Transportation Needs: Not on file  Physical Activity: Not on file  Stress: Not on file  Social Connections: Not on file   Allergies  Allergen Reactions   Peanut-Containing Drug Products Itching   Soy Allergy Swelling   Family History  Problem Relation Age of Onset   Hypertension Father    CVA Father    Stroke Father    Varicose Veins Mother    Stroke Mother    Stroke Brother        hemorrhagic       Current Outpatient Medications (Respiratory):    montelukast (SINGULAIR) 10 MG tablet, Take 1 tablet by mouth daily.   SALINE NASAL SPRAY NA, Place into the nose as needed.  Current Outpatient Medications (Analgesics):    meloxicam (MOBIC) 15 MG tablet, Take 1 tablet (15 mg total) by mouth daily.   Current Outpatient Medications (Other):    B Complex Vitamins (VITAMIN B  COMPLEX PO), Take by mouth daily.   CALCIUM PO, Take by mouth daily.   Docosahexaenoic Acid (DHA OMEGA 3 PO), Take by mouth daily.   MAGNESIUM PO, Take by mouth daily.   Multiple Vitamins-Minerals (MULTIVITAMIN PO), Take by mouth daily.   nitrofurantoin, macrocrystal-monohydrate, (MACROBID) 100 MG capsule, TAKE ONE CAPSULE BY MOUTH EVERY DAY AS DIRECTED   potassium chloride SA (K-DUR,KLOR-CON) 20 MEQ tablet, Take 20 mEq by mouth daily.   VITAMIN D, CHOLECALCIFEROL, PO, Take by mouth daily.   Vitamin D, Ergocalciferol, (DRISDOL) 1.25 MG (50000 UNIT) CAPS capsule, Take 1 capsule (50,000 Units total) by mouth every 7 (seven) days.   Reviewed prior external information including notes and imaging from  primary care provider As well as notes  that were available from care everywhere and other healthcare systems.  Past medical history, social, surgical and family history all reviewed in electronic medical record.  No pertanent information unless stated regarding to the chief complaint.   Review of Systems:  No headache, visual changes, nausea, vomiting, diarrhea, constipation, dizziness, abdominal pain, skin rash, fevers, chills, night sweats, weight loss, swollen lymph nodes, body aches, joint swelling, chest pain, shortness of breath, mood changes. POSITIVE muscle aches  Objective  Blood pressure 124/80, pulse 69, height 5\' 4"  (1.626 m), last menstrual period 10/25/2008, SpO2 99 %.   General: No apparent distress alert and oriented x3 mood and affect normal, dressed appropriately.  HEENT: Pupils equal, extraocular movements intact  Respiratory: Patient's speak in full sentences and does not appear short of breath  Cardiovascular: No lower extremity edema, non tender, no erythema  Gait normal with good balance and coordination.  MSK: Left hip exam shows the patient does have some very minimal decrease in internal and external range of motion of 5 degrees compared to the contralateral side.  Patient is nontender over the lateral lateral aspect of the greater trochanteric area of the left hip.     Impression and Recommendations:     The above documentation has been reviewed and is accurate and complete 12/26/2008, DO

## 2021-01-14 ENCOUNTER — Other Ambulatory Visit: Payer: Self-pay

## 2021-01-14 ENCOUNTER — Ambulatory Visit (INDEPENDENT_AMBULATORY_CARE_PROVIDER_SITE_OTHER): Payer: BC Managed Care – PPO

## 2021-01-14 ENCOUNTER — Ambulatory Visit: Payer: BC Managed Care – PPO | Admitting: Family Medicine

## 2021-01-14 ENCOUNTER — Encounter: Payer: Self-pay | Admitting: Family Medicine

## 2021-01-14 VITALS — BP 124/80 | HR 69 | Ht 64.0 in

## 2021-01-14 DIAGNOSIS — M25552 Pain in left hip: Secondary | ICD-10-CM

## 2021-01-14 MED ORDER — MELOXICAM 15 MG PO TABS: 15.0000 mg | ORAL_TABLET | Freq: Every day | ORAL | 0 refills | Status: DC

## 2021-01-14 MED ORDER — VITAMIN D (ERGOCALCIFEROL) 1.25 MG (50000 UNIT) PO CAPS: 50000.0000 [IU] | ORAL_CAPSULE | ORAL | 0 refills | Status: DC

## 2021-01-14 NOTE — Patient Instructions (Addendum)
Meloxicam 15mg  daily for 10 days then as needed Do not take any other NSAID with this medication Stop if feeling any stomach pain/discomfort  Greater trochanter exercises given   Left hip x ray on your way out  Once weekly VIT D sent  See me again in 6 weeks if not better will do an injection

## 2021-01-14 NOTE — Assessment & Plan Note (Signed)
Likely more of a greater trochanteric bursitis.  We discussed different treatment options and patient elected to do the home exercises.  We discussed oral anti-inflammatories to do daily for the next 10 days then as needed.  Warned the potential side effects and not taking any other over-the-counter medication with it.  Discussed icing regimen.  Worsening pain at follow-up consider potential injection.  Follow-up again in 6 weeks x-rays pending.

## 2021-01-20 DIAGNOSIS — Z86018 Personal history of other benign neoplasm: Secondary | ICD-10-CM | POA: Diagnosis not present

## 2021-01-20 DIAGNOSIS — L821 Other seborrheic keratosis: Secondary | ICD-10-CM | POA: Diagnosis not present

## 2021-01-20 DIAGNOSIS — D225 Melanocytic nevi of trunk: Secondary | ICD-10-CM | POA: Diagnosis not present

## 2021-01-20 DIAGNOSIS — L814 Other melanin hyperpigmentation: Secondary | ICD-10-CM | POA: Diagnosis not present

## 2021-02-26 ENCOUNTER — Ambulatory Visit: Payer: BC Managed Care – PPO | Admitting: Family Medicine

## 2021-02-27 ENCOUNTER — Ambulatory Visit: Payer: BC Managed Care – PPO | Admitting: Family Medicine

## 2021-03-11 NOTE — Progress Notes (Signed)
[name] [address] [place] [address] Phone: [phone] Subjective:   Bruce Donath, am serving as a scribe for Dr. Antoine Primas. This visit occurred during the SARS-CoV-2 public health emergency.  Safety protocols were in place, including screening questions prior to the visit, additional usage of staff PPE, and extensive cleaning of exam room while observing appropriate contact time as indicated for disinfecting solutions.   I'm seeing this patient by the request  of:  Joycelyn Rua, MD  CC: Left hip pain  [id number]  01/14/2021 Likely more of a greater trochanteric bursitis.  We discussed different treatment options and patient elected to do the home exercises.  We discussed oral anti-inflammatories to do daily for the next 10 days then as needed.  Warned the potential side effects and not taking any other over-the-counter medication with it.  Discussed icing regimen.  Worsening pain at follow-up consider potential injection.  Follow-up again in 6 weeks x-rays pending.  Updated 03/12/2021 JAKAYA JACOBOWITZ is a 60 y.o. female coming in with complaint of left hip pain. Patient states that she is improving. Patient does have radiating pain with sit to stand and the first few steps. Does want to discuss meloxicam and go over her xray.   Xray IMPRESSION: 1. Mild degenerative changes in the lower lumbar spine. 2. No acute or focal abnormality. Normal radiographic appearance of the left hip.      Past Medical History:  Diagnosis Date   Family history of stroke (cerebrovascular) 06/29/2018   Gestational diabetes    Headache(784.0)    migraines   HSV (herpes simplex virus) infection    Shingles 4/15   Thyroid nodule    Past Surgical History:  Procedure Laterality Date   COLONOSCOPY  12/09   normal w/Dr. Magod--next due 2019   HYSTEROSCOPY WITH NOVASURE  10/2008   TUBAL LIGATION  1993   Social History   Socioeconomic History    Marital status: Divorced    Spouse name: Not on file   Number of children: 3   Years of education: Not on file   Highest education level: Some college, no degree  Occupational History   Not on file  Tobacco Use   Smoking status: Never   Smokeless tobacco: Never  Vaping Use   Vaping Use: Never used  Substance and Sexual Activity   Alcohol use: Yes    Alcohol/week: 1.0 - 2.0 standard drink    Types: 1 - 2 Standard drinks or equivalent per week   Drug use: No   Sexual activity: Yes    Partners: Male    Birth control/protection: Other-see comments, Post-menopausal    Comment: BTL and ablation  Other Topics Concern   Not on file  Social History Narrative   Right handed   3 cups per day (coffee)   Lives at home with significant other    Social Determinants of Health   Financial Resource Strain: Not on file  Food Insecurity: Not on file  Transportation Needs: Not on file  Physical Activity: Not on file  Stress: Not on file  Social Connections: Not on file   Allergies  Allergen Reactions   Peanut-Containing Drug Products Itching   Soy Allergy Swelling   Family History  Problem Relation Age of Onset   Hypertension Father    CVA Father    Stroke Father    Varicose Veins Mother    Stroke Mother    Stroke Brother  hemorrhagic       Current Outpatient Medications (Respiratory):    montelukast (SINGULAIR) 10 MG tablet, Take 1 tablet by mouth daily.   SALINE NASAL SPRAY NA, Place into the nose as needed.  Current Outpatient Medications (Analgesics):    meloxicam (MOBIC) 15 MG tablet, Take 1 tablet (15 mg total) by mouth daily.   Current Outpatient Medications (Other):    B Complex Vitamins (VITAMIN B COMPLEX PO), Take by mouth daily.   CALCIUM PO, Take by mouth daily.   Docosahexaenoic Acid (DHA OMEGA 3 PO), Take by mouth daily.   MAGNESIUM PO, Take by mouth daily.   Multiple Vitamins-Minerals (MULTIVITAMIN PO), Take by mouth daily.   nitrofurantoin,  macrocrystal-monohydrate, (MACROBID) 100 MG capsule, TAKE ONE CAPSULE BY MOUTH EVERY DAY AS DIRECTED   potassium chloride SA (K-DUR,KLOR-CON) 20 MEQ tablet, Take 20 mEq by mouth daily.   VITAMIN D, CHOLECALCIFEROL, PO, Take by mouth daily.   Vitamin D, Ergocalciferol, (DRISDOL) 1.25 MG (50000 UNIT) CAPS capsule, Take 1 capsule (50,000 Units total) by mouth every 7 (seven) days.   Reviewed prior external information including notes and imaging from  primary care provider As well as notes that were available from care everywhere and other healthcare systems.  Past medical history, social, surgical and family history all reviewed in electronic medical record.  No pertanent information unless stated regarding to the chief complaint.   Review of Systems:  No headache, visual changes, nausea, vomiting, diarrhea, constipation, dizziness, abdominal pain, skin rash, fevers, chills, night sweats, weight loss, swollen lymph nodes, body aches, joint swelling, chest pain, shortness of breath, mood changes. POSITIVE muscle aches  Objective  Blood pressure 100/76, pulse (!) 59, height 5\' 4"  (1.626 m), weight 126 lb (57.2 kg), last menstrual period 10/25/2008, SpO2 99 %.   General: No apparent distress alert and oriented x3 mood and affect normal, dressed appropriately.  HEENT: Pupils equal, extraocular movements intact  Respiratory: Patient's speak in full sentences and does not appear short of breath  Cardiovascular: No lower extremity edema, non tender, no erythema  Gait normal with good balance and coordination.  MSK: Left hip exam does have a negative straight leg test.  No significant discomfort in the lumbar spine.  Patient is severely tender over the greater trochanteric area and swelling is noted in this area.  Positive FABER test.   Procedure: Real-time Ultrasound Guided Injection of left  greater trochanteric bursitis secondary to patient's body habitus Device: GE Logiq Q7  Ultrasound guided  injection is preferred based studies that show increased duration, increased effect, greater accuracy, decreased procedural pain, increased response rate, and decreased cost with ultrasound guided versus blind injection.  Verbal informed consent obtained.  Time-out conducted.  Noted no overlying erythema, induration, or other signs of local infection.  Skin prepped in a sterile fashion.  Local anesthesia: Topical Ethyl chloride.  With sterile technique and under real time ultrasound guidance:  Greater trochanteric area was visualized and patient's bursa was noted. A 22-gauge 3 inch needle was inserted and 2 cc of 0.5% Marcaine and 1 cc of Kenalog 40 mg/dL was injected. Pictures taken Completed without difficulty  Pain immediately resolved suggesting accurate placement of the medication.  Advised to call if fevers/chills, erythema, induration, drainage, or persistent bleeding.  Impression: Technically successful ultrasound guided injection.    Impression and Recommendations:     The above documentation has been reviewed and is accurate and complete 12/26/2008, DO

## 2021-03-12 ENCOUNTER — Encounter: Payer: Self-pay | Admitting: Family Medicine

## 2021-03-12 ENCOUNTER — Other Ambulatory Visit: Payer: Self-pay

## 2021-03-12 ENCOUNTER — Ambulatory Visit: Payer: BC Managed Care – PPO | Admitting: Family Medicine

## 2021-03-12 ENCOUNTER — Ambulatory Visit: Payer: Self-pay

## 2021-03-12 VITALS — BP 100/76 | HR 59 | Ht 64.0 in | Wt 126.0 lb

## 2021-03-12 DIAGNOSIS — M25552 Pain in left hip: Secondary | ICD-10-CM | POA: Diagnosis not present

## 2021-03-12 DIAGNOSIS — M7062 Trochanteric bursitis, left hip: Secondary | ICD-10-CM | POA: Diagnosis not present

## 2021-03-12 MED ORDER — MELOXICAM 15 MG PO TABS: 15.0000 mg | ORAL_TABLET | Freq: Every day | ORAL | 0 refills | Status: DC

## 2021-03-12 NOTE — Patient Instructions (Addendum)
Injected L GT today Ice at end of day Start exercises in a couple of days Meloxicam if you need it but try not to use regularily See me in 6-8 weeks

## 2021-03-12 NOTE — Assessment & Plan Note (Signed)
Patient given injection today and tolerated the procedure well.  Discussed icing regimen and home exercises.  Patient did have refill of the meloxicam for breakthrough pain.  Patient differential does include lumbar radiculopathy but I think this will likely.  Patient hopefully will respond extremely well to this and follow-up again in 6 to 8 weeks to be fully released

## 2021-04-22 ENCOUNTER — Other Ambulatory Visit: Payer: Self-pay | Admitting: Family Medicine

## 2021-05-27 ENCOUNTER — Other Ambulatory Visit: Payer: Self-pay | Admitting: Family Medicine

## 2021-07-01 ENCOUNTER — Other Ambulatory Visit: Payer: Self-pay | Admitting: Family Medicine

## 2021-09-09 ENCOUNTER — Other Ambulatory Visit: Payer: Self-pay | Admitting: Family Medicine

## 2021-11-17 ENCOUNTER — Other Ambulatory Visit: Payer: Self-pay | Admitting: Family Medicine

## 2022-01-03 ENCOUNTER — Other Ambulatory Visit: Payer: Self-pay | Admitting: Family Medicine

## 2022-01-19 DIAGNOSIS — Z1231 Encounter for screening mammogram for malignant neoplasm of breast: Secondary | ICD-10-CM | POA: Diagnosis not present

## 2022-01-20 ENCOUNTER — Ambulatory Visit (INDEPENDENT_AMBULATORY_CARE_PROVIDER_SITE_OTHER): Payer: BC Managed Care – PPO | Admitting: Obstetrics & Gynecology

## 2022-01-20 ENCOUNTER — Encounter (HOSPITAL_BASED_OUTPATIENT_CLINIC_OR_DEPARTMENT_OTHER): Payer: Self-pay | Admitting: Obstetrics & Gynecology

## 2022-01-20 VITALS — BP 109/73 | HR 66 | Ht 63.5 in | Wt 126.8 lb

## 2022-01-20 DIAGNOSIS — E878 Other disorders of electrolyte and fluid balance, not elsewhere classified: Secondary | ICD-10-CM | POA: Diagnosis not present

## 2022-01-20 DIAGNOSIS — Z01419 Encounter for gynecological examination (general) (routine) without abnormal findings: Secondary | ICD-10-CM | POA: Diagnosis not present

## 2022-01-20 DIAGNOSIS — Z8639 Personal history of other endocrine, nutritional and metabolic disease: Secondary | ICD-10-CM

## 2022-01-20 DIAGNOSIS — N390 Urinary tract infection, site not specified: Secondary | ICD-10-CM | POA: Diagnosis not present

## 2022-01-20 DIAGNOSIS — M810 Age-related osteoporosis without current pathological fracture: Secondary | ICD-10-CM | POA: Diagnosis not present

## 2022-01-20 DIAGNOSIS — N393 Stress incontinence (female) (male): Secondary | ICD-10-CM | POA: Diagnosis not present

## 2022-01-20 DIAGNOSIS — Z1589 Genetic susceptibility to other disease: Secondary | ICD-10-CM

## 2022-01-20 MED ORDER — NITROFURANTOIN MONOHYD MACRO 100 MG PO CAPS: ORAL_CAPSULE | ORAL | 1 refills | Status: DC

## 2022-01-20 NOTE — Progress Notes (Signed)
61 y.o. [id number] Divorced White or Caucasian female here for annual exam.  Has some questions today.  First is questions about vaginal rejuvenation surgery.    H/o thyroid nodule.  At times, feels like there is something that puts pressure on her throat and that she feels it when she swallows.  Last thyroid u/s was in 2019.  Has BMD last year showing early osteoporosis.  We discussed repeating next year to see if stable or changing.  Order palced.    Patient's last menstrual period was 04/27/2008.          Sexually active: Yes.    The current method of family planning is post menopausal status.    Exercising: Yes.     Weights and pilates Smoker:  no  Health Maintenance: Pap:  06/26/2019 Negative History of abnormal Pap:  no MMG:  12/24/2020 Negative Colonoscopy:  2009.  Overdue.  Pt aware.  This was done with Dr. Collene Mares BMD:   12/24/2020  Screening Labs: done at work   reports that she has never smoked. She has never used smokeless tobacco. She reports current alcohol use of about 1.0 - 2.0 standard drink of alcohol per week. She reports that she does not use drugs.  Past Medical History:  Diagnosis Date   Family history of stroke (cerebrovascular) 06/29/2018   Gestational diabetes    Headache(784.0)    migraines   HSV (herpes simplex virus) infection    Shingles 4/15   Thyroid nodule     Past Surgical History:  Procedure Laterality Date   COLONOSCOPY  12/09   normal w/Dr. Magod--next due 2019   HYSTEROSCOPY WITH NOVASURE  10/2008   TUBAL LIGATION  1993    Current Outpatient Medications  Medication Sig Dispense Refill   B Complex Vitamins (VITAMIN B COMPLEX PO) Take by mouth daily.     CALCIUM PO Take by mouth daily.     Docosahexaenoic Acid (DHA OMEGA 3 PO) Take by mouth daily.     MAGNESIUM PO Take by mouth daily.     meloxicam (MOBIC) 15 MG tablet TAKE ONE TABLET BY MOUTH EVERY DAY 30 tablet 0   Multiple Vitamins-Minerals (MULTIVITAMIN PO) Take by mouth daily.      potassium chloride SA (K-DUR,KLOR-CON) 20 MEQ tablet Take 20 mEq by mouth daily.     SALINE NASAL SPRAY NA Place into the nose as needed.     VITAMIN D, CHOLECALCIFEROL, PO Take by mouth daily.     nitrofurantoin, macrocrystal-monohydrate, (MACROBID) 100 MG capsule Take 1 capsule daily as needed for UTI prophylaxis 30 capsule 1   No current facility-administered medications for this visit.    Family History  Problem Relation Age of Onset   Hypertension Father    CVA Father    Stroke Father    Varicose Veins Mother    Stroke Mother    Stroke Brother        hemorrhagic     ROS: Constitutional: negative Genitourinary:negative  Exam:   BP 109/73 (BP Location: Right Arm, Patient Position: Sitting, Cuff Size: Large)   Pulse 66   Ht 5' 3.5" (1.613 m) Comment: Reported  Wt 126 lb 12.8 oz (57.5 kg)   LMP 04/27/2008   BMI 22.11 kg/m   Height: 5' 3.5" (161.3 cm) (Reported)  General appearance: alert, cooperative and appears stated age Head: Normocephalic, without obvious abnormality, atraumatic Neck: no adenopathy, supple, symmetrical, trachea midline and thyroid normal to inspection and palpation Lungs: clear to auscultation bilaterally Breasts: normal  appearance, no masses or tenderness Heart: regular rate and rhythm Abdomen: soft, non-tender; bowel sounds normal; no masses,  no organomegaly Extremities: extremities normal, atraumatic, no cyanosis or edema Skin: Skin color, texture, turgor normal. No rashes or lesions Lymph nodes: Cervical, supraclavicular, and axillary nodes normal. No abnormal inguinal nodes palpated Neurologic: Grossly normal   Pelvic: External genitalia:  no lesions              Urethra:  normal appearing urethra with no masses, tenderness or lesions              Bartholins and Skenes: normal                 Vagina: normal appearing vagina with normal color and no discharge, no lesions              Cervix: no lesions              Pap taken: No. Bimanual  Exam:  Uterus:  normal size, contour, position, consistency, mobility, non-tender              Adnexa: normal adnexa and no mass, fullness, tenderness               Rectovaginal: Confirms               Anus:  normal sphincter tone, no lesions  Chaperone, Ina Homes, CMA, was present for exam.  Assessment/Plan: 1. Well woman exam with routine gynecological exam - Pap smear 06/2019 neg with neg HR HPV - Mammogram done yesterday - Colonoscopy overdue.  Pt aware.  States she will call. - Bone mineral density will be repeated next year - lab work done done with PCP - vaccines reviewed/updated  2. Stress incontinence of urine - Ambulatory referral to Physical Therapy  3. Age-related osteoporosis without current pathological fracture - DG BONE DENSITY (DXA); Future  4. Recurrent UTI - nitrofurantoin, macrocrystal-monohydrate, (MACROBID) 100 MG capsule; Take 1 capsule daily as needed for UTI prophylaxis  Dispense: 30 capsule; Refill: 1  5. Potassium disorder - Comprehensive metabolic panel  6. History of thyroid nodule - US SOFT TISSUE HEAD & NECK (NON-THYROID); Future  7. MTHFR gene mutation

## 2022-01-21 LAB — COMPREHENSIVE METABOLIC PANEL
ALT: 22 IU/L (ref 0–32)
AST: 30 IU/L (ref 0–40)
Albumin/Globulin Ratio: 1.7 (ref 1.2–2.2)
Albumin: 4.7 g/dL (ref 3.9–4.9)
Alkaline Phosphatase: 62 IU/L (ref 44–121)
BUN/Creatinine Ratio: 15 (ref 12–28)
BUN: 11 mg/dL (ref 8–27)
Bilirubin Total: 0.4 mg/dL (ref 0.0–1.2)
CO2: 24 mmol/L (ref 20–29)
Calcium: 9.7 mg/dL (ref 8.7–10.3)
Chloride: 97 mmol/L (ref 96–106)
Creatinine, Ser: 0.73 mg/dL (ref 0.57–1.00)
Globulin, Total: 2.7 g/dL (ref 1.5–4.5)
Glucose: 94 mg/dL (ref 70–99)
Potassium: 4.9 mmol/L (ref 3.5–5.2)
Sodium: 136 mmol/L (ref 134–144)
Total Protein: 7.4 g/dL (ref 6.0–8.5)
eGFR: 92 mL/min/{1.73_m2} (ref >59)

## 2022-01-22 ENCOUNTER — Encounter (HOSPITAL_BASED_OUTPATIENT_CLINIC_OR_DEPARTMENT_OTHER): Payer: Self-pay | Admitting: *Deleted

## 2022-01-26 DIAGNOSIS — L578 Other skin changes due to chronic exposure to nonionizing radiation: Secondary | ICD-10-CM | POA: Diagnosis not present

## 2022-01-26 DIAGNOSIS — B078 Other viral warts: Secondary | ICD-10-CM | POA: Diagnosis not present

## 2022-01-26 DIAGNOSIS — L570 Actinic keratosis: Secondary | ICD-10-CM | POA: Diagnosis not present

## 2022-01-26 DIAGNOSIS — L821 Other seborrheic keratosis: Secondary | ICD-10-CM | POA: Diagnosis not present

## 2022-01-26 DIAGNOSIS — D225 Melanocytic nevi of trunk: Secondary | ICD-10-CM | POA: Diagnosis not present

## 2022-01-26 DIAGNOSIS — L814 Other melanin hyperpigmentation: Secondary | ICD-10-CM | POA: Diagnosis not present

## 2022-01-28 ENCOUNTER — Ambulatory Visit: Payer: BC Managed Care – PPO | Admitting: Physical Therapy

## 2022-01-28 NOTE — Therapy (Deleted)
OUTPATIENT PHYSICAL THERAPY FEMALE PELVIC EVALUATION   Patient Name: Kiara Gray MRN: [id number] DOB:1961-03-14, 61 y.o., female Today's Date: 01/28/2022    Past Medical History:  Diagnosis Date   Family history of stroke (cerebrovascular) 06/29/2018   Gestational diabetes    Headache(784.0)    migraines   HSV (herpes simplex virus) infection    Shingles 4/15   Thyroid nodule    Past Surgical History:  Procedure Laterality Date   COLONOSCOPY  12/09   normal w/Dr. Magod--next due 2019   HYSTEROSCOPY WITH NOVASURE  10/2008   TUBAL LIGATION  1993   Patient Active Problem List   Diagnosis Date Noted   Greater trochanteric bursitis of left hip 03/12/2021   Recurrent UTI 01/02/2021   MTHFR gene mutation 01/02/2021   S/P endometrial ablation 01/02/2021   Postmenopausal 01/02/2021   Age-related osteoporosis without current pathological fracture 01/02/2021   Left hip pain 01/02/2021   Family history of stroke (cerebrovascular) 06/29/2018    PCP: Joycelyn Rua, MD  REFERRING PROVIDER: Jerene Bears, MD  REFERRING DIAG: N39.3 (ICD-10-CM) - Stress incontinence of urine  THERAPY DIAG:  No diagnosis found.  Rationale for Evaluation and Treatment Rehabilitation  ONSET DATE: ***  SUBJECTIVE:                                                                                                                                                                                           SUBJECTIVE STATEMENT: *** Fluid intake: {Yes/No:304960894}    PAIN:  Are you having pain? {yes/no:20286} NPRS scale: ***/10 Pain location: {pelvic pain location:27098}  Pain type: {type:313116} Pain description: {PAIN DESCRIPTION:21022940}   Aggravating factors: *** Relieving factors: ***  PRECAUTIONS: {Therapy precautions:24002}  WEIGHT BEARING RESTRICTIONS {Yes ***/No:24003}  FALLS:  Has patient fallen in last 6 months? {fallsyesno:27318}  LIVING ENVIRONMENT: Lives with: {[facility]  lives with:25569::"lives with their family"} Lives in: {Lives in:25570} Stairs: {opstairs:27293} Has following equipment at home: {Assistive devices:23999}  OCCUPATION: ***  PLOF: {PLOF:24004}  PATIENT GOALS ***  PERTINENT HISTORY:  *** Sexual abuse: {Yes/No:304960894}  BOWEL MOVEMENT Pain with bowel movement: {yes/no:20286} Type of bowel movement:{PT BM type:27100} Fully empty rectum: {Yes/No:304960894} Leakage: {Yes/No:304960894} Pads: {Yes/No:304960894} Fiber supplement: {Yes/No:304960894}  URINATION Pain with urination: {yes/no:20286} Fully empty bladder: {Yes/No:304960894} Stream: {PT urination:27102} Urgency: {Yes/No:304960894} Frequency: *** Leakage: {PT leakage:27103} Pads: {Yes/No:304960894}  INTERCOURSE Pain with intercourse: {pain with intercourse PA:27099} Ability to have vaginal penetration:  {Yes/No:304960894} Climax: *** Marinoff Scale: ***/3  PREGNANCY Vaginal deliveries *** Tearing {Yes***/No:304960894} C-section deliveries *** Currently pregnant {Yes***/No:304960894}  PROLAPSE {PT prolapse:27101}    OBJECTIVE:   DIAGNOSTIC FINDINGS:  ***  PATIENT SURVEYS:  {rehab  surveys:24030}  PFIQ-7 ***  COGNITION:  Overall cognitive status: {cognition:24006}     SENSATION:  Light touch: {intact/deficits:24005}  Proprioception: {intact/deficits:24005}  MUSCLE LENGTH: Hamstrings: Right *** deg; Left *** deg Thomas test: Right *** deg; Left *** deg  LUMBAR SPECIAL TESTS:  {lumbar special test:25242}  FUNCTIONAL TESTS:  {Functional tests:24029}  GAIT: Distance walked: *** Assistive device utilized: {Assistive devices:23999} Level of assistance: {Levels of assistance:24026} Comments: ***               POSTURE: {posture:25561}   PELVIC ALIGNMENT:  LUMBARAROM/PROM  A/PROM A/PROM  eval  Flexion   Extension   Right lateral flexion   Left lateral flexion   Right rotation   Left rotation    (Blank rows = not tested)  LOWER  EXTREMITY ROM:  {AROM/PROM:27142} ROM Right eval Left eval  Hip flexion    Hip extension    Hip abduction    Hip adduction    Hip internal rotation    Hip external rotation    Knee flexion    Knee extension    Ankle dorsiflexion    Ankle plantarflexion    Ankle inversion    Ankle eversion     (Blank rows = not tested)  LOWER EXTREMITY MMT:  MMT Right eval Left eval  Hip flexion    Hip extension    Hip abduction    Hip adduction    Hip internal rotation    Hip external rotation    Knee flexion    Knee extension    Ankle dorsiflexion    Ankle plantarflexion    Ankle inversion    Ankle eversion      PALPATION:   General  ***                External Perineal Exam ***                             Internal Pelvic Floor ***  Patient confirms identification and approves PT to assess internal pelvic floor and treatment {yes/no:20286}  PELVIC MMT:   MMT eval  Vaginal   Internal Anal Sphincter   External Anal Sphincter   Puborectalis   Diastasis Recti   (Blank rows = not tested)        TONE: ***  PROLAPSE: ***  TODAY'S TREATMENT  EVAL ***   PATIENT EDUCATION:  Education details: *** Person educated: {Person educated:25204} Education method: {Education Method:25205} Education comprehension: {Education Comprehension:25206}   HOME EXERCISE PROGRAM: ***  ASSESSMENT:  CLINICAL IMPRESSION: Patient is a *** y.o. *** who was seen today for physical therapy evaluation and treatment for ***.    OBJECTIVE IMPAIRMENTS {opptimpairments:25111}.   ACTIVITY LIMITATIONS {activitylimitations:27494}  PARTICIPATION LIMITATIONS: {participationrestrictions:25113}  PERSONAL FACTORS {Personal factors:25162} are also affecting patient's functional outcome.   REHAB POTENTIAL: {rehabpotential:25112}  CLINICAL DECISION MAKING: {clinical decision making:25114}  EVALUATION COMPLEXITY: {Evaluation complexity:25115}   GOALS: Goals reviewed with patient?  {yes/no:20286}  SHORT TERM GOALS: Target date: {follow up:25551}  *** Baseline: Goal status: {GOALSTATUS:25110}  2.  *** Baseline:  Goal status: {GOALSTATUS:25110}  3.  *** Baseline:  Goal status: {GOALSTATUS:25110}  4.  *** Baseline:  Goal status: {GOALSTATUS:25110}  5.  *** Baseline:  Goal status: {GOALSTATUS:25110}  6.  *** Baseline:  Goal status: {GOALSTATUS:25110}  LONG TERM GOALS: Target date: {follow up:25551}   *** Baseline:  Goal status: {GOALSTATUS:25110}  2.  *** Baseline:  Goal status: {GOALSTATUS:25110}  3.  *** Baseline:  Goal status: {GOALSTATUS:25110}  4.  *** Baseline:  Goal status: {GOALSTATUS:25110}  5.  *** Baseline:  Goal status: {GOALSTATUS:25110}  6.  *** Baseline:  Goal status: {GOALSTATUS:25110}  PLAN: PT FREQUENCY: {rehab frequency:25116}  PT DURATION: {rehab duration:25117}  PLANNED INTERVENTIONS: {rehab planned interventions:25118::"Therapeutic exercises","Therapeutic activity","Neuromuscular re-education","Balance training","Gait training","Patient/Family education","Self Care","Joint mobilization"}  PLAN FOR NEXT SESSION: ***   Brayton Caves Krissi Willaims, PT 01/28/2022, 9:48 AM

## 2022-02-02 ENCOUNTER — Other Ambulatory Visit: Payer: Self-pay

## 2022-02-02 ENCOUNTER — Encounter: Payer: Self-pay | Admitting: Physical Therapy

## 2022-02-02 ENCOUNTER — Ambulatory Visit: Payer: BC Managed Care – PPO | Attending: Obstetrics & Gynecology | Admitting: Physical Therapy

## 2022-02-02 DIAGNOSIS — N393 Stress incontinence (female) (male): Secondary | ICD-10-CM | POA: Diagnosis not present

## 2022-02-02 DIAGNOSIS — R278 Other lack of coordination: Secondary | ICD-10-CM | POA: Insufficient documentation

## 2022-02-02 NOTE — Therapy (Addendum)
OUTPATIENT PHYSICAL THERAPY FEMALE PELVIC EVALUATION   Patient Name: Kiara Gray MRN: [id number] DOB:06-Feb-1961, 61 y.o., female Today's Date: 02/03/2022   PT End of Session - 02/02/22 1102     Visit Number 1    Number of Visits 6    Date for PT Re-Evaluation 04/27/22    Authorization Type [employer]    PT Start Time 1102    PT Stop Time 1141    PT Time Calculation (min) 39 min    Activity Tolerance Patient tolerated treatment well    Behavior During Therapy [facility] for tasks assessed/performed             Past Medical History:  Diagnosis Date   Family history of stroke (cerebrovascular) 06/29/2018   Gestational diabetes    Headache(784.0)    migraines   HSV (herpes simplex virus) infection    Shingles 4/15   Thyroid nodule    Past Surgical History:  Procedure Laterality Date   COLONOSCOPY  12/09   normal w/Dr. Magod--next due 2019   HYSTEROSCOPY WITH NOVASURE  10/2008   TUBAL LIGATION  1993   Patient Active Problem List   Diagnosis Date Noted   Greater trochanteric bursitis of left hip 03/12/2021   Recurrent UTI 01/02/2021   MTHFR gene mutation 01/02/2021   S/P endometrial ablation 01/02/2021   Postmenopausal 01/02/2021   Age-related osteoporosis without current pathological fracture 01/02/2021   Left hip pain 01/02/2021   Family history of stroke (cerebrovascular) 06/29/2018    PCP: Joycelyn Rua, MD  REFERRING PROVIDER: Jerene Bears, MD   REFERRING DIAG: Stress Incontinence of Urine   THERAPY DIAG:  Stress incontinence (female) (female) - Plan: PT plan of care cert/re-cert  Other lack of coordination - Plan: PT plan of care cert/re-cert  Rationale for Evaluation and Treatment Rehabilitation  ONSET DATE: 3-6 months   SUBJECTIVE:                                                                                                                                                                                           SUBJECTIVE STATEMENT: She is having  some leakage that has gradual gotten worsen since 3-6 months ago. 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 Fluid intake: Yes: glasses,  decaf tea , coffee     PAIN:  Are you having pain? No   PRECAUTIONS: None  WEIGHT BEARING RESTRICTIONS No  FALLS:  Has patient fallen in last 6 months? No  LIVING ENVIRONMENT: Lives with: lives with their family  and lives alone Lives in: House/apartment  OCCUPATION: Yes- [employer]  PLOF: Independent  PATIENT GOALS Prevent more leakage and strengthening the pelvic floor muscles   PERTINENT HISTORY:  HSV ( gherpes simplex virus) infection, [place] of stroke, S/P endometerial ablation, postmenopausal, left hip pain, and  recurrent UTI  Sexual abuse: No  BOWEL MOVEMENT Pain with bowel movement: No Type of bowel movement:Strain No Fully empty rectum: Yes:   Leakage: No Pads: No Fiber supplement: Yes:    URINATION Pain with urination: No Fully empty bladder: Yes:   Stream: Strong Urgency: No but sometimes  Frequency: in the morning every hour and in afternoon about every two hours  Leakage: Coughing, Sneezing, and Laughing- twice a week  Pads: Yes: Panty liner  Nocturia- every once in a while and once    INTERCOURSE Pain with intercourse:  No Ability to have vaginal penetration:  Yes:   Climax: Every once in a while/ inconsistent    PREGNANCY Vaginal deliveries 3 Tearing No  PROLAPSE None    OBJECTIVE:   DIAGNOSTIC FINDINGS:    PATIENT SURVEYS:    PFIQ-7   COGNITION:  Overall cognitive status: Within functional limits for tasks assessed     SENSATION:  Light touch: Appears intact  Proprioception: Appears intact  MUSCLE LENGTH: Hamstrings: Right 80 deg; Left 90 deg   LUMBAR SPECIAL TESTS:  Active Straight leg raise test: Positive and Single leg stance test: Positive- with noticeable hip drop when standing on left leg   ASLR positive with pelvic compression demonstrating core weakness  FUNCTIONAL TESTS:   GAIT: Distance walked: WFL               POSTURE: No Significant postural limitations   PELVIC ALIGNMENT: normal LUMBARAROM/PROM  A/PROM A/PROM  eval  Flexion WFL  Extension   Right lateral flexion   Left lateral flexion   Right rotation   Left rotation    (Blank rows = not tested)  LOWER EXTREMITY ROM:  Passive ROM Right eval Left eval  Hip flexion wfl wfl  Hip extension    Hip abduction    Hip adduction    Hip internal rotation 50% 50%  Hip external rotation 75% 75%  Knee flexion    Knee extension    Ankle dorsiflexion    Ankle plantarflexion    Ankle inversion    Ankle eversion     (Blank rows = not tested)  LOWER EXTREMITY MMT:  MMT Right eval Left eval  Hip flexion 5/5 5/5  Hip extension    Hip abduction 5/5 5/5  Hip adduction 5/5 5/5  Hip internal rotation 4/5 4/5  Hip external rotation 4/5 4/5  Knee flexion    Knee extension    Ankle dorsiflexion    Ankle plantarflexion    Ankle inversion    Ankle eversion      PALPATION:   General- No TTP                External Perineal Exam: Adductor attachment tightness                              Internal Pelvic Floor: Knack- relax then contract   Patient confirms identification and approves PT to assess internal pelvic floor and treatment Yes  No emotional/communication barriers or cognitive limitation. Patient is motivated to learn. Patient understands and agrees with treatment goals and plan. PT explains patient will be examined in standing, sitting, and  lying down to see how their muscles and joints work. When they are ready, they will be asked to remove their underwear so PT can examine their perineum. The patient is also given the option of providing their own chaperone as one is not provided in our facility. The patient also has the right and is explained the right to defer or refuse any part of the  evaluation or treatment including the internal exam. With the patient's consent, PT will use one gloved finger to gently assess the muscles of the pelvic floor, seeing how well it contracts and relaxes and if there is muscle symmetry. After, the patient will get dressed and PT and patient will discuss exam findings and plan of care. PT and patient discuss plan of care, schedule, attendance policy and HEP activities.   PELVIC MMT:   MMT eval  Vaginal 3/5, 4 reps not relaxing easily, 3 sec holds   Internal Anal Sphincter   External Anal Sphincter   Puborectalis   Diastasis Recti   (Blank rows = not tested)        TONE: Normal -low tone   PROLAPSE: Not assessed   TODAY'S TREATMENT  EVAL and initial HEP was given [employer] Diaphragmatic breathing with pelvic floor contraction  Diaphragmatic breathing with hip Abduction and extension    PATIENT EDUCATION:  Education details: HEP provided and demonstrated Person educated: Patient Education method: Explanation, Demonstration, and Handouts Education comprehension: verbalized understanding   HOME EXERCISE PROGRAM: Access Code: [email or web address] URL: [email or web address] Date: 02/02/2022 Prepared by: Dwana Curd Exercises - Supine Pelvic Floor Contraction - 3 x daily - 7 x weekly - 1 sets - 10 reps - 3 sec hold - Standing Hip Abduction with Counter Support - 1 x daily - 7 x weekly - 3 sets - 10 reps - Standing Hip Extension with Counter Support - 1 x daily - 7 x weekly - 3 sets - 10 reps  ASSESSMENT:  CLINICAL IMPRESSION: Patient is a 61 y.o. female who was seen today for physical therapy evaluation and treatment for  stress urinary incontinence. Pt presented with overall good functional strength and mobility. She demonstrated some glute weakness in single leg stance and lacking internal and external rotation ROM and strength bilateral. With the internal pelvic floor examination, pt demonstrated a 3/5 strength, 4 reps with  inability to relax easily, and 3 sec holds also a delayed contraction with the knack. These findings indicated a lack of coordination and endurance of the pelvic floor muscles. Patient had difficulty with coordination of diaphragmatic breathing with pelvic floor contraction and had a lot glute activation when instructed to contract her pelvic floor. Patient was provided an initial HEP program to address pelvic floor coordination and hip strengthening. Patient would benefit from skilled therapy to address impairments listed above in order to regain function and improvement of QOL.   OBJECTIVE IMPAIRMENTS decreased coordination, decreased endurance, decreased strength, and hypomobility.   ACTIVITY LIMITATIONS carrying, lifting, and continence  PARTICIPATION LIMITATIONS: community activity  PERSONAL FACTORS Age and 1-2 comorbidities: 3 vaginal deliveries and tubal ligation  are also affecting patient's functional outcome.   REHAB POTENTIAL: Excellent  CLINICAL DECISION MAKING: Evolving/moderate complexity  EVALUATION COMPLEXITY: Moderate   GOALS: Goals reviewed with patient? Yes  SHORT TERM GOALS: Target date: 03/09/2022  Patient with be independent with initial HEP  Baseline: Goal status: INITIAL  LONG TERM GOALS: Target date: 04/27/2022   Pt will be independent with advanced HEP to maintain improvements made throughout therapy  Baseline:  Goal status: INITIAL  2.  Patient will increase her hip internal and external rotation strength all to a 5/5 strength to demonstrate a increase in functional hip strength . Baseline:4/5  Goal status: INITIAL  3.  Patient will report having leakage 0-1 time during the week to demonstrate an improvement in pelvic floor coordination and QOL. Baseline:  twice a week Goal status: INITIAL  4.  Patient will be able to maintain a pelvic floor contraction for 6 seconds to demonstrate an increase in pelvic floor endurance so will report less leakage with  coughing. Baseline: 3 seconds Goal status: INITIAL  5. Patient will report only using the restroom every 2 hours throughout the morning  to demonstrate more functional pelvic floor strength. Baseline: every hour  Goal status: INITIAL  6.  Pt will have to use 0 panty liner to demonstrate an decrease of leakage due to an increase in pelvic floor strengthening Baseline: 1 Goal status: INITIAL  PLAN: PT FREQUENCY: 1x/week  PT DURATION: 12 weeks  PLANNED INTERVENTIONS: Therapeutic exercises, Therapeutic activity, Neuromuscular re-education, Balance training, Gait training, Patient/Family education, Self Care, Joint mobilization, Dry Needling, Cryotherapy, Moist heat, Taping, Biofeedback, and Manual therapy  PLAN FOR NEXT SESSION: Pelvic floor engagement and coordination, hip and core straightening    Brayton Caves Desenglau, PT 02/03/2022, 9:27 AM

## 2022-02-03 NOTE — Addendum Note (Signed)
Addended by: Su Hoff on: 02/03/2022 09:28 AM   Modules accepted: Orders

## 2022-02-05 ENCOUNTER — Other Ambulatory Visit: Payer: Self-pay | Admitting: Family Medicine

## 2022-02-17 NOTE — Therapy (Addendum)
OUTPATIENT PHYSICAL THERAPY FEMALE PELVIC TREATMENT   Patient Name: Kiara Gray MRN: [id number] DOB:1960/10/17, 61 y.o., female Today's Date: 02/18/2022   PT End of Session - 02/18/22 1126     Visit Number 2    Number of Visits 6    Date for PT Re-Evaluation 04/27/22    Authorization Type BCBS    PT Start Time 1016    PT Stop Time 1057    PT Time Calculation (min) 41 min    Activity Tolerance Patient tolerated treatment well    Behavior During Therapy [facility] for tasks assessed/performed              Past Medical History:  Diagnosis Date   Family history of stroke (cerebrovascular) 06/29/2018   Gestational diabetes    Headache(784.0)    migraines   HSV (herpes simplex virus) infection    Shingles 4/15   Thyroid nodule    Past Surgical History:  Procedure Laterality Date   COLONOSCOPY  12/09   normal w/Dr. Magod--next due 2019   HYSTEROSCOPY WITH NOVASURE  10/2008   TUBAL LIGATION  1993   Patient Active Problem List   Diagnosis Date Noted   Greater trochanteric bursitis of left hip 03/12/2021   Recurrent UTI 01/02/2021   MTHFR gene mutation 01/02/2021   S/P endometrial ablation 01/02/2021   Postmenopausal 01/02/2021   Age-related osteoporosis without current pathological fracture 01/02/2021   Left hip pain 01/02/2021   Family history of stroke (cerebrovascular) 06/29/2018    PCP: Orpah Melter, MD  REFERRING PROVIDER: Orpah Melter, MD   REFERRING DIAG: Stress Incontinence of Urine   THERAPY DIAG:  Stress incontinence (female) (female)  Other lack of coordination  Rationale for Evaluation and Treatment Rehabilitation  ONSET DATE: 3-6 months   SUBJECTIVE:                                                                                                                                                                                           SUBJECTIVE STATEMENT: She is having some leakage that has gradual gotten worsen since 3-6 months ago. 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 Fluid intake: Yes: glasses,  decaf tea , coffee     PAIN:  Are you having pain? No   PRECAUTIONS: None  WEIGHT BEARING RESTRICTIONS No  FALLS:  Has patient fallen in last 6 months? No  LIVING ENVIRONMENT: Lives with: lives with their family and lives alone Lives in: House/apartment  OCCUPATION: Yes- [employer]  PLOF: Independent  PATIENT GOALS Prevent more leakage and strengthening the pelvic floor muscles   PERTINENT HISTORY:  HSV ( gherpes simplex virus) infection, [place] of stroke, S/P endometerial ablation, postmenopausal, left hip pain, and  recurrent UTI  Sexual abuse: No  BOWEL MOVEMENT Pain with bowel movement: No Type of bowel movement:Strain No Fully empty rectum: Yes:   Leakage: No Pads: No Fiber supplement: Yes:    URINATION Pain with urination: No Fully empty bladder: Yes:   Stream: Strong Urgency: No but sometimes  Frequency: in the morning every hour and in afternoon about every two hours  Leakage: Coughing, Sneezing, and Laughing- twice a week  Pads: Yes: Panty liner  Nocturia- every once in a while and once    INTERCOURSE Pain with intercourse:  No Ability to have vaginal penetration:  Yes:   Climax: Every once in a while/ inconsistent    PREGNANCY Vaginal deliveries 3 Tearing No  PROLAPSE None    OBJECTIVE:   DIAGNOSTIC FINDINGS:    PATIENT SURVEYS:    PFIQ-7   COGNITION:  Overall cognitive status: Within functional limits for tasks assessed     SENSATION:  Light touch: Appears intact  Proprioception: Appears intact  MUSCLE LENGTH: Hamstrings: Right 80 deg; Left 90 deg   LUMBAR SPECIAL TESTS:  Active Straight leg raise test: Positive and Single leg stance test: Positive- with noticeable hip drop when standing on left leg  ASLR positive with pelvic compression demonstrating core  weakness  FUNCTIONAL TESTS:   GAIT: Distance walked: WFL               POSTURE: No Significant postural limitations   PELVIC ALIGNMENT: normal LUMBARAROM/PROM  A/PROM A/PROM  eval  Flexion WFL  Extension   Right lateral flexion   Left lateral flexion   Right rotation   Left rotation    (Blank rows = not tested)  LOWER EXTREMITY ROM:  Passive ROM Right eval Left eval  Hip flexion wfl wfl  Hip extension    Hip abduction    Hip adduction    Hip internal rotation 50% 50%  Hip external rotation 75% 75%  Knee flexion    Knee extension    Ankle dorsiflexion    Ankle plantarflexion    Ankle inversion    Ankle eversion     (Blank rows = not tested)  LOWER EXTREMITY MMT:  MMT Right eval Left eval  Hip flexion 5/5 5/5  Hip extension    Hip abduction 5/5 5/5  Hip adduction 5/5 5/5  Hip internal rotation 4/5 4/5  Hip external rotation 4/5 4/5  Knee flexion    Knee extension    Ankle dorsiflexion    Ankle plantarflexion    Ankle inversion    Ankle eversion      PALPATION:   General- No TTP                External Perineal Exam: Adductor attachment tightness                              Internal Pelvic Floor: Knack- relax then contract   Patient confirms identification and approves PT to assess internal pelvic floor and treatment Yes  No emotional/communication barriers or cognitive limitation. Patient is motivated to learn. Patient understands and agrees with treatment goals and plan. PT explains patient will be examined in standing, sitting, and lying down to see how their muscles and joints work. When they are ready,  they will be asked to remove their underwear so PT can examine their perineum. The patient is also given the option of providing their own chaperone as one is not provided in our facility. The patient also has the right and is explained the right to defer or refuse any part of the evaluation or treatment including the internal exam. With the  patient's consent, PT will use one gloved finger to gently assess the muscles of the pelvic floor, seeing how well it contracts and relaxes and if there is muscle symmetry. After, the patient will get dressed and PT and patient will discuss exam findings and plan of care. PT and patient discuss plan of care, schedule, attendance policy and HEP activities.   PELVIC MMT:   MMT eval  Vaginal 3/5, 4 reps not relaxing easily, 3 sec holds   Internal Anal Sphincter   External Anal Sphincter   Puborectalis   Diastasis Recti   (Blank rows = not tested)        TONE: Normal -low tone   PROLAPSE: Not assessed   TODAY'S TREATMENT  Date: 02/18/22  Nuero Re-ed: Education and cues for coordination of breathing  Pelvic floor muscle contracting and relaxing at appropriate times during activities  Exercises: Supine UE overhead with weights Marching with kegel Breathing with kegel Seated Hip IR and ER - blue loop - 20x Lifting 3lb weights - 15x Standing  Red band walk outs 20x Red band pallof press and squats 15x each way Eliptial x 3.5 min - fwd/back with attempting to kegel for 3 seconds intermittently- unable to feel it very much   PATIENT EDUCATION:  Education details: HEP provided and demonstrated Person educated: Patient Education method: Explanation, Demonstration, and Handouts Education comprehension: verbalized understanding   HOME EXERCISE PROGRAM: Access Code: [email or web address] URL: [email or web address] Date: 02/02/2022 Prepared by: Jari Favre Exercises - Supine Pelvic Floor Contraction - 3 x daily - 7 x weekly - 1 sets - 10 reps - 3 sec hold - Standing Hip Abduction with Counter Support - 1 x daily - 7 x weekly - 3 sets - 10 reps - Standing Hip Extension with Counter Support - 1 x daily - 7 x weekly - 3 sets - 10 reps  ASSESSMENT:  CLINICAL IMPRESSION: Patient presents to clinic for initial follow up today and reports a little less leakage.  She has been  successful with her initial HEP. She was able to progress her exercises today and given updates in HEP.  Pt wil benefit from skilled PT to continue to work on increased strength for not having leakage with coughing and sneezing.   OBJECTIVE IMPAIRMENTS decreased coordination, decreased endurance, decreased strength, and hypomobility.   ACTIVITY LIMITATIONS carrying, lifting, and continence  PARTICIPATION LIMITATIONS: community activity  PERSONAL FACTORS Age and 1-2 comorbidities: 3 vaginal deliveries and tubal ligation  are also affecting patient's functional outcome.   REHAB POTENTIAL: Excellent  CLINICAL DECISION MAKING: Evolving/moderate complexity  EVALUATION COMPLEXITY: Moderate   GOALS: Goals reviewed with patient? Yes  SHORT TERM GOALS: Target date: 03/09/2022  Patient with be independent with initial HEP  Baseline: Goal status: MET  LONG TERM GOALS: Target date: 04/27/2022   Pt will be independent with advanced HEP to maintain improvements made throughout therapy  Baseline:  Goal status: INITIAL  2.  Patient will increase her hip internal and external rotation strength all to a 5/5 strength to demonstrate a increase in functional hip strength . Baseline:4/5  Goal status: INITIAL  3.  Patient will  report having leakage 0-1 time during the week to demonstrate an improvement in pelvic floor coordination and QOL. Baseline:  twice a week Goal status: INITIAL  4.  Patient will be able to maintain a pelvic floor contraction for 6 seconds to demonstrate an increase in pelvic floor endurance so will report less leakage with coughing. Baseline: 3 seconds Goal status: INITIAL  5. Patient will report only using the restroom every 2 hours throughout the morning  to demonstrate more functional pelvic floor strength. Baseline: every hour  Goal status: INITIAL  6.  Pt will have to use 0 panty liner to demonstrate an decrease of leakage due to an increase in pelvic floor  strengthening Baseline: 1 Goal status: INITIAL  PLAN: PT FREQUENCY: 1x/week  PT DURATION: 12 weeks  PLANNED INTERVENTIONS: Therapeutic exercises, Therapeutic activity, Neuromuscular re-education, Balance training, Gait training, Patient/Family education, Self Care, Joint mobilization, Dry Needling, Cryotherapy, Moist heat, Taping, Biofeedback, and Manual therapy  PLAN FOR NEXT SESSION: Progress pelvic floor engagement and coordination, hip and core straightening    Camillo Flaming Waino Mounsey, PT 02/18/2022, 11:27 AM   PHYSICAL THERAPY DISCHARGE SUMMARY  Visits from Start of Care: 2  Current functional level related to goals / functional outcomes: See above goals   Remaining deficits: See above   Education / Equipment: HEP   Patient agrees to discharge. Patient goals were not met. Patient is being discharged due to not returning since the last visit.  Gustavus Bryant, PT, DPT 05/15/22 8:35 AM

## 2022-02-18 ENCOUNTER — Encounter: Payer: Self-pay | Admitting: Physical Therapy

## 2022-02-18 ENCOUNTER — Ambulatory Visit: Payer: BC Managed Care – PPO | Admitting: Physical Therapy

## 2022-02-18 DIAGNOSIS — R278 Other lack of coordination: Secondary | ICD-10-CM | POA: Diagnosis not present

## 2022-02-18 DIAGNOSIS — N393 Stress incontinence (female) (male): Secondary | ICD-10-CM | POA: Diagnosis not present

## 2022-02-19 ENCOUNTER — Ambulatory Visit (HOSPITAL_BASED_OUTPATIENT_CLINIC_OR_DEPARTMENT_OTHER)
Admission: RE | Admit: 2022-02-19 | Discharge: 2022-02-19 | Disposition: A | Discharge status: Home / Self Care | Payer: BC Managed Care – PPO | Source: Ambulatory Visit | Attending: Obstetrics & Gynecology | Admitting: Obstetrics & Gynecology

## 2022-02-19 DIAGNOSIS — Z8639 Personal history of other endocrine, nutritional and metabolic disease: Secondary | ICD-10-CM | POA: Insufficient documentation

## 2022-02-19 DIAGNOSIS — E041 Nontoxic single thyroid nodule: Secondary | ICD-10-CM | POA: Diagnosis not present

## 2022-02-26 ENCOUNTER — Ambulatory Visit: Payer: BC Managed Care – PPO | Admitting: Physical Therapy

## 2022-03-06 ENCOUNTER — Ambulatory Visit: Payer: BC Managed Care – PPO | Admitting: Physical Therapy

## 2022-03-09 ENCOUNTER — Encounter: Payer: BC Managed Care – PPO | Admitting: Physical Therapy

## 2022-03-12 ENCOUNTER — Other Ambulatory Visit: Payer: Self-pay | Admitting: Family Medicine

## 2022-03-13 ENCOUNTER — Other Ambulatory Visit: Payer: Self-pay

## 2022-03-13 MED ORDER — MELOXICAM 15 MG PO TABS: 15.0000 mg | ORAL_TABLET | Freq: Every day | ORAL | 0 refills | Status: DC

## 2022-03-26 NOTE — Addendum Note (Signed)
Addended by: Beatris Si on: 03/26/2022 02:25 PM   Modules accepted: Orders

## 2022-03-27 NOTE — Progress Notes (Unsigned)
[facility] [address] [place] [address] Phone: [phone] Subjective:   Bruce Donath, am serving as a scribe for Dr. Antoine Primas.  I'm seeing this patient by the request  of:  Joycelyn Rua, MD  CC: Left hip pain and back pain follow-up  TDV:[id number]  Kiara Gray is a 61 y.o. female coming in with complaint of L hip pain. Last seen for hip pain in 2022. Patient states that she is having radiating pain down lateral aspect of L hip. Radiation seems to be worse than last year. Painful to lie on that side at night. Pain located over greater trochanter and occurring intermittently. Injection last year did help. Uses meloxicam.   L hip xray 12/2020 IMPRESSION: 1. Mild degenerative changes in the lower lumbar spine. 2. No acute or focal abnormality. Normal radiographic appearance of the left hip.     Past Medical History:  Diagnosis Date   Family history of stroke (cerebrovascular) 06/29/2018   Gestational diabetes    Headache(784.0)    migraines   HSV (herpes simplex virus) infection    Shingles 4/15   Thyroid nodule    Past Surgical History:  Procedure Laterality Date   COLONOSCOPY  12/09   normal w/Dr. Magod--next due 2019   HYSTEROSCOPY WITH NOVASURE  10/2008   TUBAL LIGATION  1993   Social History   Socioeconomic History   Marital status: Divorced    Spouse name: Not on file   Number of children: 3   Years of education: Not on file   Highest education level: Some college, no degree  Occupational History   Not on file  Tobacco Use   Smoking status: Never   Smokeless tobacco: Never  Vaping Use   Vaping Use: Never used  Substance and Sexual Activity   Alcohol use: Yes    Alcohol/week: 1.0 - 2.0 standard drink of alcohol    Types: 1 - 2 Standard drinks or equivalent per week   Drug use: No   Sexual activity: Yes    Partners: Male    Birth control/protection: Other-see comments, Post-menopausal    Comment:  BTL and ablation  Other Topics Concern   Not on file  Social History Narrative   Right handed   3 cups per day (coffee)   Lives at home with significant other    Social Determinants of Health   Financial Resource Strain: Not on file  Food Insecurity: Not on file  Transportation Needs: Not on file  Physical Activity: Not on file  Stress: Not on file  Social Connections: Not on file   Allergies  Allergen Reactions   Peanut-Containing Drug Products Itching   Soy Allergy Swelling   Family History  Problem Relation Age of Onset   Hypertension Father    CVA Father    Stroke Father    Varicose Veins Mother    Stroke Mother    Stroke Brother        hemorrhagic       Current Outpatient Medications (Respiratory):    SALINE NASAL SPRAY NA, Place into the nose as needed.  Current Outpatient Medications (Analgesics):    meloxicam (MOBIC) 15 MG tablet, Take 1 tablet (15 mg total) by mouth daily.   Current Outpatient Medications (Other):    B Complex Vitamins (VITAMIN B COMPLEX PO), Take by mouth daily.   CALCIUM PO, Take by mouth daily.   Docosahexaenoic Acid (DHA OMEGA 3 PO), Take by mouth daily.  gabapentin (NEURONTIN) 100 MG capsule, Take 2 capsules (200 mg total) by mouth at bedtime.   MAGNESIUM PO, Take by mouth daily.   Multiple Vitamins-Minerals (MULTIVITAMIN PO), Take by mouth daily.   nitrofurantoin, macrocrystal-monohydrate, (MACROBID) 100 MG capsule, Take 1 capsule daily as needed for UTI prophylaxis   potassium chloride SA (K-DUR,KLOR-CON) 20 MEQ tablet, Take 20 mEq by mouth daily.   VITAMIN D, CHOLECALCIFEROL, PO, Take by mouth daily.   Reviewed prior external information including notes and imaging from  primary care provider As well as notes that were available from care everywhere and other healthcare systems.  Past medical history, social, surgical and family history all reviewed in electronic medical record.  No pertanent information unless stated  regarding to the chief complaint.   Review of Systems:  No headache, visual changes, nausea, vomiting, diarrhea, constipation, dizziness, abdominal pain, skin rash, fevers, chills, night sweats, weight loss, swollen lymph nodes, body aches, joint swelling, chest pain, shortness of breath, mood changes. POSITIVE muscle aches  Objective  Blood pressure 100/72, pulse 83, height 5' 3.5" (1.613 m), last menstrual period 04/27/2008, SpO2 98 %.   General: No apparent distress alert and oriented x3 mood and affect normal, dressed appropriately.  HEENT: Pupils equal, extraocular movements intact  Respiratory: Patient's speak in full sentences and does not appear short of breath  Cardiovascular: No lower extremity edema, non tender, no erythema  Tender to palpation over the greater trochanteric area on the left side.  Patient does have mild tightness with FABER test left greater than right as well.  Negative straight leg test but does lack the last 10 degrees of extension of the noted.   After verbal consent patient was prepped in sterile fashion with alcohol swabs. Ethyl chloride used patient was injected with a 22-gauge 3 inch needle into the left lateral hip in the greater trochanteric area under ultrasound guidance. Picture was taken. Patient had 4 cc of 0.5% Marcaine and 1 cc of Kenalog 40 mg/dL injected. Patient tolerated the procedure well and no blood loss. Pain completely resolved after injection stating proper placement. Post injection instructions given.   Impression and Recommendations:    The above documentation has been reviewed and is accurate and complete [name], DO

## 2022-03-31 ENCOUNTER — Encounter: Payer: Self-pay | Admitting: Family Medicine

## 2022-03-31 ENCOUNTER — Ambulatory Visit (INDEPENDENT_AMBULATORY_CARE_PROVIDER_SITE_OTHER): Payer: BC Managed Care – PPO

## 2022-03-31 ENCOUNTER — Ambulatory Visit (INDEPENDENT_AMBULATORY_CARE_PROVIDER_SITE_OTHER): Payer: BC Managed Care – PPO | Admitting: Family Medicine

## 2022-03-31 VITALS — BP 100/72 | HR 83 | Ht 63.5 in

## 2022-03-31 DIAGNOSIS — M545 Low back pain, unspecified: Secondary | ICD-10-CM

## 2022-03-31 DIAGNOSIS — M7062 Trochanteric bursitis, left hip: Secondary | ICD-10-CM | POA: Diagnosis not present

## 2022-03-31 MED ORDER — GABAPENTIN 100 MG PO CAPS: 200.0000 mg | ORAL_CAPSULE | Freq: Every day | ORAL | 0 refills | Status: DC

## 2022-03-31 MED ORDER — MELOXICAM 15 MG PO TABS: 15.0000 mg | ORAL_TABLET | Freq: Every day | ORAL | 3 refills | Status: DC

## 2022-03-31 NOTE — Patient Instructions (Addendum)
Meloxicam called into your pharmacy Injected hip Gabapentin 200mg  at night See me in 2 months just in case

## 2022-03-31 NOTE — Assessment & Plan Note (Signed)
Chronic problem with worsening symptoms.  Now having some more radicular symptoms noted as well.  Does have x-rays of the hip showing some degenerative disc disease.  Will get a lumbar x-ray to further evaluate.  Discussed with patient about icing regimen, home exercises, which activities to do and which ones to avoid.  Started meloxicam 15 mg daily and gabapentin 200 mg at night follow-up again in 6 to 8 weeks

## 2022-04-10 ENCOUNTER — Ambulatory Visit: Payer: BC Managed Care – PPO | Admitting: Family Medicine

## 2022-07-27 NOTE — Progress Notes (Unsigned)
    Benito Mccreedy D.[employer] Phone: [phone]   Assessment and Plan:     There are no diagnoses linked to this encounter.  ***   Pertinent previous records reviewed include ***   Follow Up: ***     Subjective:   I, Kiara Gray, am serving as a Education administrator for Doctor Glennon Mac  Chief Complaint: left hip pain   HPI:   07/28/2022 Patient is a 62 year old female complaining of left hip pain. Patient states  Relevant Historical Information: ***  Additional pertinent review of systems negative.   Current Outpatient Medications:    B Complex Vitamins (VITAMIN B COMPLEX PO), Take by mouth daily., Disp: , Rfl:    CALCIUM PO, Take by mouth daily., Disp: , Rfl:    Docosahexaenoic Acid (DHA OMEGA 3 PO), Take by mouth daily., Disp: , Rfl:    gabapentin (NEURONTIN) 100 MG capsule, Take 2 capsules (200 mg total) by mouth at bedtime., Disp: 180 capsule, Rfl: 0   MAGNESIUM PO, Take by mouth daily., Disp: , Rfl:    meloxicam (MOBIC) 15 MG tablet, Take 1 tablet (15 mg total) by mouth daily., Disp: 90 tablet, Rfl: 3   Multiple Vitamins-Minerals (MULTIVITAMIN PO), Take by mouth daily., Disp: , Rfl:    nitrofurantoin, macrocrystal-monohydrate, (MACROBID) 100 MG capsule, Take 1 capsule daily as needed for UTI prophylaxis, Disp: 30 capsule, Rfl: 1   potassium chloride SA (K-DUR,KLOR-CON) 20 MEQ tablet, Take 20 mEq by mouth daily., Disp: , Rfl:    SALINE NASAL SPRAY NA, Place into the nose as needed., Disp: , Rfl:    VITAMIN D, CHOLECALCIFEROL, PO, Take by mouth daily., Disp: , Rfl:    Objective:     There were no vitals filed for this visit.    There is no height or weight on file to calculate BMI.    Physical Exam:    ***   Electronically signed by:  Benito Mccreedy D.[facility] 7:29 AM 07/27/22

## 2022-07-28 ENCOUNTER — Ambulatory Visit: Payer: BC Managed Care – PPO | Admitting: Sports Medicine

## 2022-07-28 VITALS — HR 66 | Ht 63.0 in | Wt 133.0 lb

## 2022-07-28 DIAGNOSIS — M7062 Trochanteric bursitis, left hip: Secondary | ICD-10-CM

## 2022-07-28 NOTE — Patient Instructions (Addendum)
Good to see you  Glute HEP May use Voltaren gel over areas of pain As needed follow up

## 2022-08-03 IMAGING — DX DG HIP (WITH OR WITHOUT PELVIS) 2-3V*L*
3 series · 3 of 3 positions shown · non-contrast
Comparison: None.

CLINICAL DATA: Left hip pain. Intermittent pain anteriorly in the
left hip over the last 2 years.

EXAM:
DG HIP (WITH OR WITHOUT PELVIS) 2-3V LEFT

[pelvis ap]
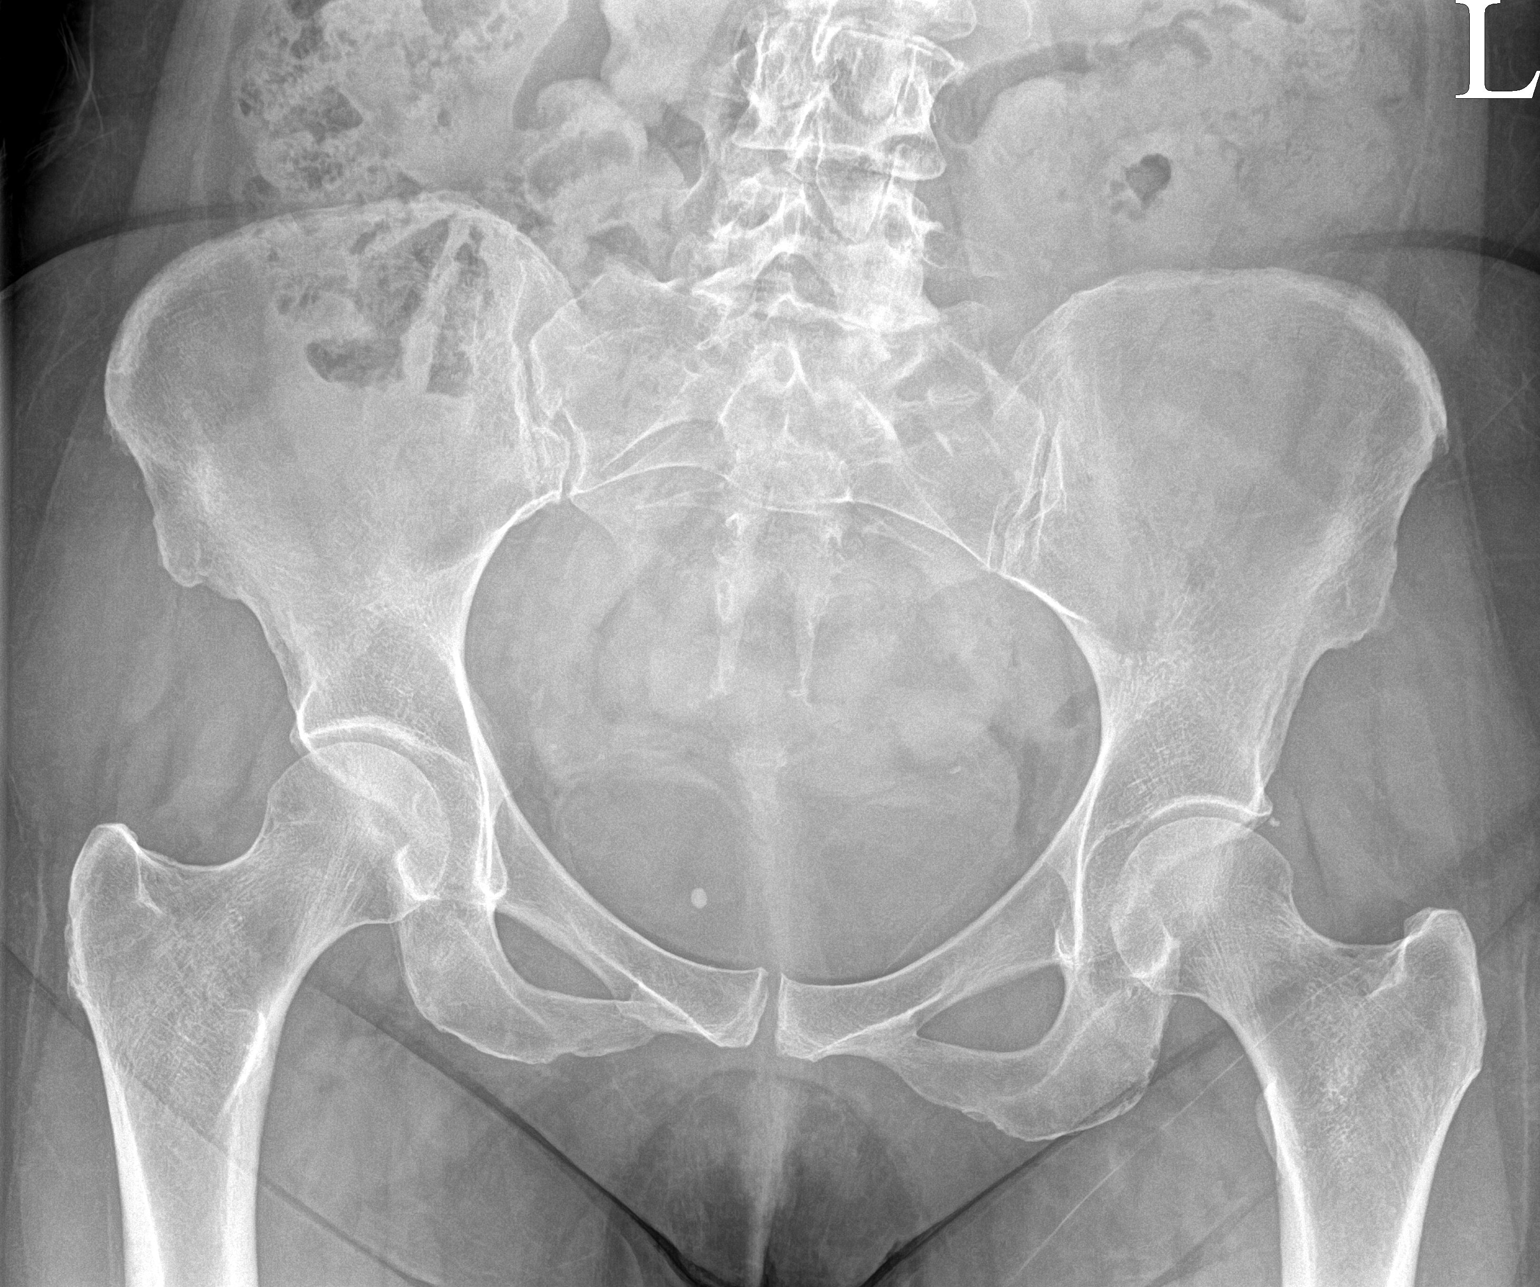

[hip ap]
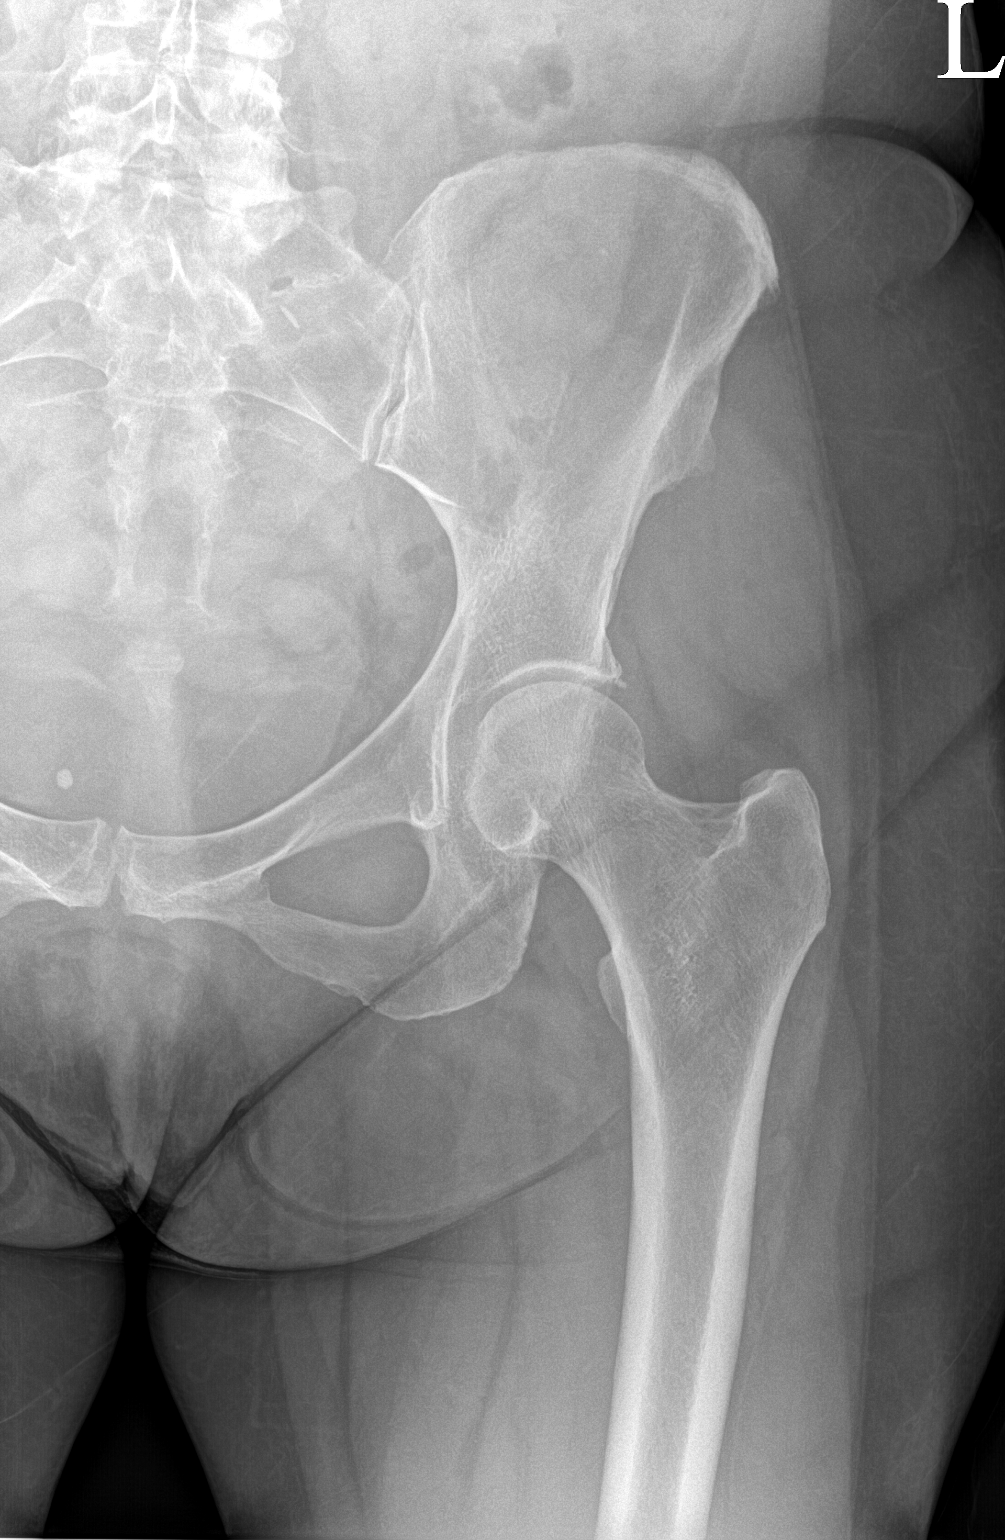

[hip frog leg]
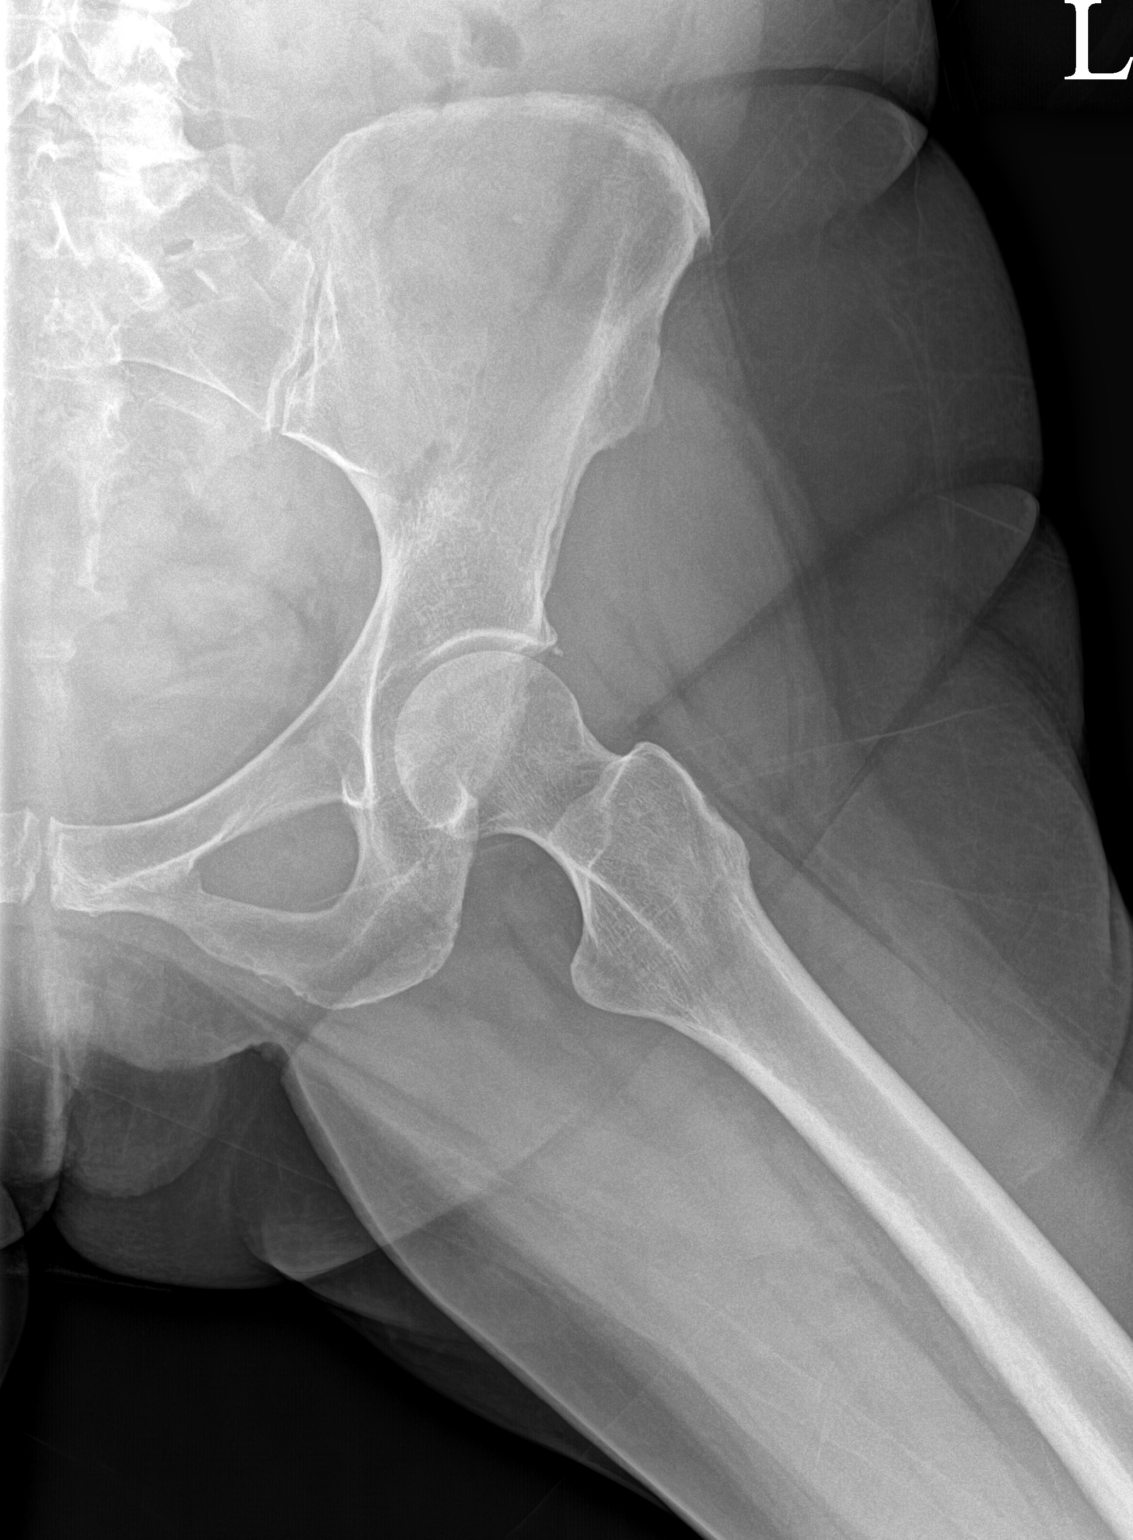

[3 of 3 positions shown; findings below may reference images not displayed]

FINDINGS: Mild degenerative changes are present in lower lumbar spine,
particularly at L5-S1.

Left hip located. No acute or focal abnormality is present. Joint
spaces preserved.
IMPRESSION: 1. Mild degenerative changes in the lower lumbar spine.
2. No acute or focal abnormality. Normal radiographic appearance of
the left hip.

## 2022-09-07 DIAGNOSIS — J029 Acute pharyngitis, unspecified: Secondary | ICD-10-CM | POA: Diagnosis not present

## 2022-09-07 DIAGNOSIS — R52 Pain, unspecified: Secondary | ICD-10-CM | POA: Diagnosis not present

## 2022-09-14 ENCOUNTER — Other Ambulatory Visit: Payer: Self-pay | Admitting: Family Medicine

## 2022-10-01 DIAGNOSIS — F4323 Adjustment disorder with mixed anxiety and depressed mood: Secondary | ICD-10-CM | POA: Diagnosis not present

## 2022-10-05 DIAGNOSIS — F4323 Adjustment disorder with mixed anxiety and depressed mood: Secondary | ICD-10-CM | POA: Diagnosis not present

## 2022-10-12 DIAGNOSIS — F4323 Adjustment disorder with mixed anxiety and depressed mood: Secondary | ICD-10-CM | POA: Diagnosis not present

## 2022-10-22 DIAGNOSIS — F4323 Adjustment disorder with mixed anxiety and depressed mood: Secondary | ICD-10-CM | POA: Diagnosis not present

## 2022-10-31 ENCOUNTER — Other Ambulatory Visit (HOSPITAL_BASED_OUTPATIENT_CLINIC_OR_DEPARTMENT_OTHER): Payer: Self-pay | Admitting: Obstetrics & Gynecology

## 2022-10-31 DIAGNOSIS — N390 Urinary tract infection, site not specified: Secondary | ICD-10-CM

## 2022-11-02 NOTE — Progress Notes (Unsigned)
[name] D.[facility] [address] [place] [address] Phone: [phone]   Assessment and Plan:     There are no diagnoses linked to this encounter.  ***   Pertinent previous records reviewed include ***   Follow Up: ***     Subjective:   I, Kiara Gray, am serving as a Neurosurgeon for Kiara Gray   Chief Complaint: left hip pain    HPI:  01/14/2021 Likely more of a greater trochanteric bursitis.  We discussed different treatment options and patient elected to do the home exercises.  We discussed oral anti-inflammatories to do daily for the next 10 days then as needed.  Warned the potential side effects and not taking any other over-the-counter medication with it.  Discussed icing regimen.  Worsening pain at follow-up consider potential injection.  Follow-up again in 6 weeks x-rays pending.   Updated 03/12/2021 Kiara Gray is a 62 y.o. female coming in with complaint of left hip pain. Patient states that she is improving. Patient does have radiating pain with sit to stand and the first few steps. Does want to discuss meloxicam and go over her xray.    Xray IMPRESSION: 1. Mild degenerative changes in the lower lumbar spine. 2. No acute or focal abnormality. Normal radiographic appearance of the left hip.   03/31/2022 Kiara Gray is a 62 y.o. female coming in with complaint of L hip pain. Last seen for hip pain in 2022. Patient states that she is having radiating pain down lateral aspect of L hip. Radiation seems to be worse than last year. Painful to lie on that side at night. Pain located over greater trochanter and occurring intermittently. Injection last year did help. Uses meloxicam.      07/28/2022 Patient is a 62 year old female complaining of left hip pain. Patient states she was working out last Tuesday and she was planking and other things the next day she was in a lot of pain , is game for a steroid  shot , and HEP   11/03/2022 Patient states    Relevant Historical Information: Osteoporosis Additional pertinent review of systems negative.   Current Outpatient Medications:    B Complex Vitamins (VITAMIN B COMPLEX PO), Take by mouth daily., Disp: , Rfl:    CALCIUM PO, Take by mouth daily., Disp: , Rfl:    Docosahexaenoic Acid (DHA OMEGA 3 PO), Take by mouth daily., Disp: , Rfl:    gabapentin (NEURONTIN) 100 MG capsule, TAKE TWO CAPSULES BY MOUTH AT BEDTIME, Disp: 180 capsule, Rfl: 0   MAGNESIUM PO, Take by mouth daily., Disp: , Rfl:    meloxicam (MOBIC) 15 MG tablet, Take 1 tablet (15 mg total) by mouth daily., Disp: 90 tablet, Rfl: 3   Multiple Vitamins-Minerals (MULTIVITAMIN PO), Take by mouth daily., Disp: , Rfl:    nitrofurantoin, macrocrystal-monohydrate, (MACROBID) 100 MG capsule, Take 1 capsule daily as needed for UTI prophylaxis, Disp: 30 capsule, Rfl: 1   potassium chloride SA (K-DUR,KLOR-CON) 20 MEQ tablet, Take 20 mEq by mouth daily., Disp: , Rfl:    SALINE NASAL SPRAY NA, Place into the nose as needed., Disp: , Rfl:    VITAMIN D, CHOLECALCIFEROL, PO, Take by mouth daily., Disp: , Rfl:    Objective:     There were no vitals filed for this visit.    There is no height or weight on file to calculate BMI.    Physical Exam:    ***  Electronically signed by:  [name] D.[facility] 12:27 PM 11/02/22

## 2022-11-03 ENCOUNTER — Ambulatory Visit: Payer: BC Managed Care – PPO | Admitting: Sports Medicine

## 2022-11-03 VITALS — BP 106/70 | HR 76 | Ht 63.0 in | Wt 129.0 lb

## 2022-11-03 DIAGNOSIS — M25552 Pain in left hip: Secondary | ICD-10-CM

## 2022-11-03 DIAGNOSIS — M7062 Trochanteric bursitis, left hip: Secondary | ICD-10-CM

## 2022-11-03 DIAGNOSIS — F4321 Adjustment disorder with depressed mood: Secondary | ICD-10-CM | POA: Diagnosis not present

## 2022-11-03 NOTE — Patient Instructions (Signed)
Thank you for coming in today.   You received an injection today. Seek immediate medical attention if the joint becomes red, extremely painful, or is oozing fluid.   Have a fabulous beach trip!

## 2022-11-23 DIAGNOSIS — S80862A Insect bite (nonvenomous), left lower leg, initial encounter: Secondary | ICD-10-CM | POA: Diagnosis not present

## 2022-11-24 DIAGNOSIS — F4321 Adjustment disorder with depressed mood: Secondary | ICD-10-CM | POA: Diagnosis not present

## 2022-12-08 DIAGNOSIS — F4321 Adjustment disorder with depressed mood: Secondary | ICD-10-CM | POA: Diagnosis not present

## 2022-12-31 DIAGNOSIS — F4321 Adjustment disorder with depressed mood: Secondary | ICD-10-CM | POA: Diagnosis not present

## 2023-01-06 DIAGNOSIS — F4321 Adjustment disorder with depressed mood: Secondary | ICD-10-CM | POA: Diagnosis not present

## 2023-01-28 ENCOUNTER — Ambulatory Visit (HOSPITAL_BASED_OUTPATIENT_CLINIC_OR_DEPARTMENT_OTHER): Payer: BC Managed Care – PPO | Admitting: Obstetrics & Gynecology

## 2023-02-10 ENCOUNTER — Ambulatory Visit (HOSPITAL_BASED_OUTPATIENT_CLINIC_OR_DEPARTMENT_OTHER): Payer: BC Managed Care – PPO | Admitting: Certified Nurse Midwife

## 2023-02-10 ENCOUNTER — Encounter (HOSPITAL_BASED_OUTPATIENT_CLINIC_OR_DEPARTMENT_OTHER): Payer: Self-pay | Admitting: Certified Nurse Midwife

## 2023-02-10 ENCOUNTER — Other Ambulatory Visit (HOSPITAL_COMMUNITY)
Admission: RE | Admit: 2023-02-10 | Discharge: 2023-02-10 | Disposition: A | Discharge status: Home / Self Care | Payer: BC Managed Care – PPO | Source: Ambulatory Visit | Attending: Certified Nurse Midwife | Admitting: Certified Nurse Midwife

## 2023-02-10 VITALS — BP 108/80 | HR 66 | Ht 63.0 in | Wt 129.6 lb

## 2023-02-10 DIAGNOSIS — Z01419 Encounter for gynecological examination (general) (routine) without abnormal findings: Secondary | ICD-10-CM | POA: Insufficient documentation

## 2023-02-10 DIAGNOSIS — Z780 Asymptomatic menopausal state: Secondary | ICD-10-CM | POA: Diagnosis not present

## 2023-02-10 DIAGNOSIS — N952 Postmenopausal atrophic vaginitis: Secondary | ICD-10-CM | POA: Diagnosis not present

## 2023-02-10 DIAGNOSIS — Z9889 Other specified postprocedural states: Secondary | ICD-10-CM

## 2023-02-10 DIAGNOSIS — M810 Age-related osteoporosis without current pathological fracture: Secondary | ICD-10-CM

## 2023-02-10 MED ORDER — ESTRADIOL 0.1 MG/GM VA CREA: TOPICAL_CREAM | VAGINAL | 12 refills | Status: DC

## 2023-02-10 NOTE — Progress Notes (Signed)
Works full time at [employer]. Enjoys her work. May consider retirement around 30.  Has 3 daughters and 9 grandchildren (3 grand-sons and 6 grand-daughters). Oldest daughter has 5, 1 has 2, youngest has 2.   Pt is in a monogamous relationship with her significant other. She experiences dryness with intercourse.   Has mammogram and Bone Density Scan pending.   62 y.o. [id number] Divorced White or Caucasian female here for annual exam.    Patient's last menstrual period was 04/27/2008.          Sexually active: Yes.    The current method of family planning is post menopausal status.     Exercising: Yes.     Includes weight bearing exercise and lifting weights Smoker:  no  Health Maintenance: Pap:  Last pap 2021, repeat planned today History of abnormal Pap:  no MMG:  Mammogram pending 01/2023 Colonoscopy:  discuss with PCP BMD:   BMD pending 10/24 (Hx Osteopososis) Screening Labs: PCP Flu Vaccine: will receive at work Dermatology: 01/26/22   reports that she has never smoked. She has never used smokeless tobacco. She reports current alcohol use of about 1.0 - 2.0 standard drink of alcohol per week. She reports that she does not use drugs.  Past Medical History:  Diagnosis Date   Family history of stroke (cerebrovascular) 06/29/2018   Gestational diabetes    Headache(784.0)    migraines   HSV (herpes simplex virus) infection    Shingles 4/15   Thyroid nodule     Past Surgical History:  Procedure Laterality Date   COLONOSCOPY  12/09   normal w/Dr. Magod--next due 2019   HYSTEROSCOPY WITH NOVASURE  10/2008   TUBAL LIGATION  1993    Current Outpatient Medications  Medication Sig Dispense Refill   B Complex Vitamins (VITAMIN B COMPLEX PO) Take by mouth daily.     CALCIUM PO Take by mouth daily.     Docosahexaenoic Acid (DHA OMEGA 3 PO) Take by mouth daily.     estradiol (ESTRACE) 0.1 MG/GM vaginal cream Apply a fingertip amount to vaginal opening three times  weekly 42.5 g 12   MAGNESIUM PO Take by mouth daily.     meloxicam (MOBIC) 15 MG tablet Take 1 tablet (15 mg total) by mouth daily. 90 tablet 3   Multiple Vitamins-Minerals (MULTIVITAMIN PO) Take by mouth daily.     potassium chloride SA (K-DUR,KLOR-CON) 20 MEQ tablet Take 20 mEq by mouth daily.     SALINE NASAL SPRAY NA Place into the nose as needed.     VITAMIN D, CHOLECALCIFEROL, PO Take by mouth daily.     gabapentin (NEURONTIN) 100 MG capsule TAKE TWO CAPSULES BY MOUTH AT BEDTIME (Patient not taking: Reported on 02/10/2023) 180 capsule 0   nitrofurantoin, macrocrystal-monohydrate, (MACROBID) 100 MG capsule Take 1 capsule daily as needed for UTI prophylaxis (Patient not taking: Reported on 02/10/2023) 30 capsule 1   No current facility-administered medications for this visit.    Family History  Problem Relation Age of Onset   Hypertension Father    CVA Father    Stroke Father    Varicose Veins Mother    Stroke Mother    Stroke Brother        hemorrhagic     ROS: Constitutional: negative Genitourinary:positive for vaginal dryness  Exam:   BP 108/80 (BP Location: Left Arm, Patient Position: Sitting, Cuff Size: Normal)   Pulse 66   Ht 5\' 3"  (1.6 m)   Wt 129 lb  9.6 oz (58.8 kg)   LMP 04/27/2008   BMI 22.96 kg/m   Height: 5\' 3"  (160 cm)  General appearance: alert, cooperative and appears stated age Head: Normocephalic, without obvious abnormality, atraumatic Neck: no adenopathy, supple, symmetrical, trachea midline Lungs: clear to auscultation bilaterally Breasts: normal appearance, no masses or tenderness, Inspection negative, No nipple retraction or dimpling, No nipple discharge or bleeding, No axillary or supraclavicular adenopathy, Normal to palpation without dominant masses Heart: regular rate and rhythm Abdomen: soft, non-tender; bowel sounds normal; no masses,  no organomegaly Extremities: extremities normal, atraumatic, no cyanosis or edema Skin: Skin color,  texture, turgor normal. No rashes or lesions Lymph nodes: Cervical, supraclavicular, and axillary nodes normal. No abnormal inguinal nodes palpated Neurologic: Grossly normal   Pelvic: External genitalia:  no lesions              Urethra:  normal appearing urethra with no masses, tenderness or lesions              Bartholins and Skenes: normal                 Vagina: pale slightly atrophied vagina with no discharge, no lesions              Cervix: no bleeding following Pap, no cervical motion tenderness, and no lesions              Pap taken: Yes.   Bimanual Exam:  Uterus:  normal size, contour, position, consistency, mobility, non-tender              Adnexa: no mass, fullness, tenderness               Anus:  normal sphincter tone, no lesions  Chaperone, Raynelle Dick, CMA, was present for exam.  Assessment/Plan:  1. Vaginal atrophy -Lubricant w/ intercourse - estradiol (ESTRACE) 0.1 MG/GM vaginal cream; Apply a fingertip amount to vaginal opening three times weekly  Dispense: 42.5 g; Refill: 12  2. Well woman exam with routine gynecological exam -Routine pap smear collected  3. Age-related osteoporosis without current pathological fracture -Bone Density pending   4. Postmenopausal -No c/o currently other than vaginal dryness  5. S/P endometrial ablation  RTO one year for annual gyn exam and prn if issues arise. Follow-up with patient once BMD results available.  Letta Kocher

## 2023-02-10 NOTE — Addendum Note (Signed)
Addended by: Hendricks Milo on: 02/10/2023 03:15 PM   Modules accepted: Orders

## 2023-02-12 LAB — CYTOLOGY - PAP
Comment: NEGATIVE
Diagnosis: NEGATIVE
High risk HPV: NEGATIVE

## 2023-02-15 DIAGNOSIS — Z1231 Encounter for screening mammogram for malignant neoplasm of breast: Secondary | ICD-10-CM | POA: Diagnosis not present

## 2023-02-15 DIAGNOSIS — Z8262 Family history of osteoporosis: Secondary | ICD-10-CM | POA: Diagnosis not present

## 2023-02-15 DIAGNOSIS — M8588 Other specified disorders of bone density and structure, other site: Secondary | ICD-10-CM | POA: Diagnosis not present

## 2023-02-16 DIAGNOSIS — L280 Lichen simplex chronicus: Secondary | ICD-10-CM | POA: Diagnosis not present

## 2023-02-16 DIAGNOSIS — D485 Neoplasm of uncertain behavior of skin: Secondary | ICD-10-CM | POA: Diagnosis not present

## 2023-02-22 ENCOUNTER — Encounter (HOSPITAL_BASED_OUTPATIENT_CLINIC_OR_DEPARTMENT_OTHER): Payer: Self-pay | Admitting: *Deleted

## 2023-03-02 NOTE — Progress Notes (Unsigned)
[name] D.[facility] [address] [place] [address] Phone: [phone]   Assessment and Plan:     There are no diagnoses linked to this encounter.  ***   Pertinent previous records reviewed include ***   Follow Up: ***     Subjective:   I, Zeina Akkerman, am serving as a Neurosurgeon for Doctor Richardean Sale   Chief Complaint: left hip pain    HPI:  01/14/2021 Likely more of a greater trochanteric bursitis.  We discussed different treatment options and patient elected to do the home exercises.  We discussed oral anti-inflammatories to do daily for the next 10 days then as needed.  Warned the potential side effects and not taking any other over-the-counter medication with it.  Discussed icing regimen.  Worsening pain at follow-up consider potential injection.  Follow-up again in 6 weeks x-rays pending.   Updated 03/12/2021 Kiara Gray is a 62 y.o. female coming in with complaint of left hip pain. Patient states that she is improving. Patient does have radiating pain with sit to stand and the first few steps. Does want to discuss meloxicam and go over her xray.    Xray IMPRESSION: 1. Mild degenerative changes in the lower lumbar spine. 2. No acute or focal abnormality. Normal radiographic appearance of the left hip.   03/31/2022 Kiara Gray is a 62 y.o. female coming in with complaint of L hip pain. Last seen for hip pain in 2022. Patient states that she is having radiating pain down lateral aspect of L hip. Radiation seems to be worse than last year. Painful to lie on that side at night. Pain located over greater trochanter and occurring intermittently. Injection last year did help. Uses meloxicam.      07/28/2022 Patient is a 62 year old female complaining of left hip pain. Patient states she was working out last Tuesday and she was planking and other things the next day she was in a lot of pain , is game for a steroid  shot , and HEP    11/03/2022 Patient states L hip pain is not as severe, but is esp bothersome at night. Pain may have been exacerbates by carrying around her grandkids over the weekend. She is leaving for a beach trip on Sunday. Pt locates pain to the anterior-lateral aspect of her L hip.    03/03/2023 Patient states  Relevant Historical Information: Osteoporosis  Additional pertinent review of systems negative.   Current Outpatient Medications:    B Complex Vitamins (VITAMIN B COMPLEX PO), Take by mouth daily., Disp: , Rfl:    CALCIUM PO, Take by mouth daily., Disp: , Rfl:    Docosahexaenoic Acid (DHA OMEGA 3 PO), Take by mouth daily., Disp: , Rfl:    estradiol (ESTRACE) 0.1 MG/GM vaginal cream, Apply a fingertip amount to vaginal opening three times weekly, Disp: 42.5 g, Rfl: 12   gabapentin (NEURONTIN) 100 MG capsule, TAKE TWO CAPSULES BY MOUTH AT BEDTIME (Patient not taking: Reported on 02/10/2023), Disp: 180 capsule, Rfl: 0   MAGNESIUM PO, Take by mouth daily., Disp: , Rfl:    meloxicam (MOBIC) 15 MG tablet, Take 1 tablet (15 mg total) by mouth daily., Disp: 90 tablet, Rfl: 3   Multiple Vitamins-Minerals (MULTIVITAMIN PO), Take by mouth daily., Disp: , Rfl:    nitrofurantoin, macrocrystal-monohydrate, (MACROBID) 100 MG capsule, Take 1 capsule daily as needed for UTI prophylaxis (Patient not taking: Reported on 02/10/2023), Disp: 30 capsule, Rfl: 1  potassium chloride SA (K-DUR,KLOR-CON) 20 MEQ tablet, Take 20 mEq by mouth daily., Disp: , Rfl:    SALINE NASAL SPRAY NA, Place into the nose as needed., Disp: , Rfl:    VITAMIN D, CHOLECALCIFEROL, PO, Take by mouth daily., Disp: , Rfl:    Objective:     There were no vitals filed for this visit.    There is no height or weight on file to calculate BMI.    Physical Exam:    ***   Electronically signed by:  [name] D[facility] 3:55 PM 03/02/23

## 2023-03-03 ENCOUNTER — Ambulatory Visit: Payer: BC Managed Care – PPO | Admitting: Sports Medicine

## 2023-03-03 VITALS — BP 120/82 | HR 85 | Ht 63.0 in | Wt 130.0 lb

## 2023-03-03 DIAGNOSIS — M25552 Pain in left hip: Secondary | ICD-10-CM

## 2023-03-03 DIAGNOSIS — M7062 Trochanteric bursitis, left hip: Secondary | ICD-10-CM | POA: Diagnosis not present

## 2023-03-03 NOTE — Patient Instructions (Signed)
Discontinue meloxicam use remainder as needed no more than 1-2 times per week Tylenol [phone] mg 2-3 times a day for pain relief  As  needed follow up

## 2023-03-22 ENCOUNTER — Other Ambulatory Visit: Payer: Self-pay | Admitting: Family Medicine

## 2023-04-24 ENCOUNTER — Other Ambulatory Visit: Payer: Self-pay | Admitting: Family Medicine

## 2023-05-07 DIAGNOSIS — F4321 Adjustment disorder with depressed mood: Secondary | ICD-10-CM | POA: Diagnosis not present

## 2023-05-25 DIAGNOSIS — F4321 Adjustment disorder with depressed mood: Secondary | ICD-10-CM | POA: Diagnosis not present

## 2023-06-01 NOTE — Progress Notes (Signed)
 [facility] [address] [place] [address] Phone: [phone] Subjective:   Kiara Gray, am serving as a scribe for Dr. Arthea Gray.  I'm seeing this patient by the request  of:  Nanci Senior, MD  CC: Left hip pain  YEP:Dlagzrupcz  03/31/2022 Chronic problem with worsening symptoms.  Now having some more radicular symptoms noted as well.  Does have x-rays of the hip showing some degenerative disc disease.  Will get a lumbar x-ray to further evaluate.  Discussed with patient about icing regimen, home exercises, which activities to do and which ones to avoid.  Started meloxicam  15 mg daily and gabapentin  200 mg at night follow-up again in 6 to 8 weeks     Updated 06/03/2023 Kiara Gray is a 63 y.o. female coming in with complaint of L hip pain. Wants meloxicam  refilled. Patient states that her pain will radiate down the leg. Has been seeing Dr. Leonce for injections. Pain in lumbar spine on R side from lifting her grandkids.       Past Medical History:  Diagnosis Date   Family history of stroke (cerebrovascular) 06/29/2018   Gestational diabetes    Headache(784.0)    migraines   HSV (herpes simplex virus) infection    Shingles 4/15   Thyroid  nodule    Past Surgical History:  Procedure Laterality Date   COLONOSCOPY  12/09   normal w/Dr. Magod--next due 2019   HYSTEROSCOPY WITH NOVASURE  10/2008   TUBAL LIGATION  1993   Social History   Socioeconomic History   Marital status: Divorced    Spouse name: Not on file   Number of children: 3   Years of education: Not on file   Highest education level: Some college, no degree  Occupational History   Not on file  Tobacco Use   Smoking status: Never   Smokeless tobacco: Never  Vaping Use   Vaping status: Never Used  Substance and Sexual Activity   Alcohol use: Yes    Alcohol/week: 1.0 - 2.0 standard drink of alcohol    Types: 1 - 2 Standard drinks or equivalent per week    Drug use: No   Sexual activity: Yes    Partners: Male    Birth control/protection: Other-see comments, Post-menopausal    Comment: BTL and ablation  Other Topics Concern   Not on file  Social History Narrative   Right handed   3 cups per day (coffee)   Lives at home with significant other    Social Drivers of Corporate Investment Banker Strain: Not on file  Food Insecurity: Not on file  Transportation Needs: Not on file  Physical Activity: Not on file  Stress: Not on file  Social Connections: Unknown (09/06/2021)   [facility]   Social Network    Social Network: Not on file   Allergies  Allergen Reactions   Peanut-Containing Drug Products Itching   Soy Allergy (Obsolete) Swelling   Family History  Problem Relation Age of Onset   Hypertension Father    CVA Father    Stroke Father    Varicose Veins Mother    Stroke Mother    Stroke Brother        hemorrhagic       Current Outpatient Medications (Respiratory):    SALINE NASAL SPRAY NA, Place into the nose as needed.  Current Outpatient Medications (Analgesics):    meloxicam  (MOBIC ) 15 MG tablet, Take 1  tablet (15 mg total) by mouth daily.   Current Outpatient Medications (Other):    B Complex Vitamins (VITAMIN B COMPLEX PO), Take by mouth daily.   CALCIUM PO, Take by mouth daily.   Docosahexaenoic Acid (DHA OMEGA 3 PO), Take by mouth daily.   estradiol  (ESTRACE ) 0.1 MG/GM vaginal cream, Apply a fingertip amount to vaginal opening three times weekly   gabapentin  (NEURONTIN ) 100 MG capsule, TAKE TWO CAPSULES BY MOUTH AT BEDTIME   MAGNESIUM PO, Take by mouth daily.   Multiple Vitamins-Minerals (MULTIVITAMIN PO), Take by mouth daily.   nitrofurantoin , macrocrystal-monohydrate, (MACROBID ) 100 MG capsule, Take 1 capsule daily as needed for UTI prophylaxis   potassium chloride SA (K-DUR,KLOR-CON) 20 MEQ tablet, Take 20 mEq by mouth daily.   VITAMIN D , CHOLECALCIFEROL, PO, Take by mouth  daily.   Reviewed prior external information including notes and imaging from  primary care provider As well as notes that were available from care everywhere and other healthcare systems.  Past medical history, social, surgical and family history all reviewed in electronic medical record.  No pertanent information unless stated regarding to the chief complaint.   Review of Systems:  No headache, visual changes, nausea, vomiting, diarrhea, constipation, dizziness, abdominal pain, skin rash, fevers, chills, night sweats, weight loss, swollen lymph nodes, body aches, joint swelling, chest pain, shortness of breath, mood changes. POSITIVE muscle aches  Objective  Blood pressure 120/78, pulse 76, height 5' 3 (1.6 m), weight 127 lb (57.6 kg), last menstrual period 04/27/2008, SpO2 96%.   General: No apparent distress alert and oriented x3 mood and affect normal, dressed appropriately.  HEENT: Pupils equal, extraocular movements intact  Respiratory: Patient's speak in full sentences and does not appear short of breath  Cardiovascular: No lower extremity edema, non tender, no erythema  Left hip to severe tenderness to palpation in the paraspinal musculature.  Patient does have pain over the greater trochanteric area.  Negative straight leg test noted.  After verbal consent was prepped with alcohol swab and with a 21-gauge inject into the left greater trochanteric area with a 3 cc of 0.5% Marcaine and 1 cc of Kenalog  40 mg/mL.  No blood loss.  Band-Aid placed.  Postinjection instructions given.   Impression and Recommendations:     The above documentation has been reviewed and is accurate and complete Davey Limas M Karmen Altamirano, DO

## 2023-06-03 ENCOUNTER — Ambulatory Visit: Payer: BC Managed Care – PPO | Admitting: Family Medicine

## 2023-06-03 ENCOUNTER — Encounter: Payer: Self-pay | Admitting: Family Medicine

## 2023-06-03 VITALS — BP 120/78 | HR 76 | Ht 63.0 in | Wt 127.0 lb

## 2023-06-03 DIAGNOSIS — M25552 Pain in left hip: Secondary | ICD-10-CM

## 2023-06-03 DIAGNOSIS — M7062 Trochanteric bursitis, left hip: Secondary | ICD-10-CM

## 2023-06-03 NOTE — Assessment & Plan Note (Signed)
 Repeat injection given today, tolerated the procedure well, discussed icing regimen and home exercises, increase activity slowly.  Discussed icing regimen.  Follow-up again in 6 to 8 weeks

## 2023-06-03 NOTE — Patient Instructions (Signed)
 Great to see you, I do think that the back pain is secondary to the hip. Injected the hip today. Try ice 20 minutes 2 times a day. Refill of the meloxicam  to take in 5-day burst as well as refill of the gabapentin  to take nightly See me again in 12 weeks

## 2023-06-07 ENCOUNTER — Other Ambulatory Visit: Payer: Self-pay | Admitting: Family Medicine

## 2023-06-07 ENCOUNTER — Telehealth: Payer: Self-pay | Admitting: Family Medicine

## 2023-06-07 MED ORDER — MELOXICAM 15 MG PO TABS: 15.0000 mg | ORAL_TABLET | Freq: Every day | ORAL | 3 refills | Status: AC

## 2023-06-07 MED ORDER — GABAPENTIN 100 MG PO CAPS: 200.0000 mg | ORAL_CAPSULE | Freq: Every day | ORAL | 0 refills | Status: DC

## 2023-06-07 NOTE — Telephone Encounter (Signed)
 Prescriptions refilled.

## 2023-06-07 NOTE — Telephone Encounter (Signed)
 Patient called to follow up on the medications that were discussed at her visit. She said that Gabapentin  and Meloxicam  were supposed to be sent to [facility]?  Please advise.

## 2023-06-17 DIAGNOSIS — F4321 Adjustment disorder with depressed mood: Secondary | ICD-10-CM | POA: Diagnosis not present

## 2023-07-15 DIAGNOSIS — F4321 Adjustment disorder with depressed mood: Secondary | ICD-10-CM | POA: Diagnosis not present

## 2023-08-16 DIAGNOSIS — F4321 Adjustment disorder with depressed mood: Secondary | ICD-10-CM | POA: Diagnosis not present

## 2023-08-30 ENCOUNTER — Other Ambulatory Visit (HOSPITAL_BASED_OUTPATIENT_CLINIC_OR_DEPARTMENT_OTHER): Payer: Self-pay | Admitting: Obstetrics & Gynecology

## 2023-08-30 ENCOUNTER — Ambulatory Visit: Admitting: Sports Medicine

## 2023-08-30 VITALS — BP 110/76 | HR 76 | Ht 63.0 in | Wt 127.0 lb

## 2023-08-30 DIAGNOSIS — M4126 Other idiopathic scoliosis, lumbar region: Secondary | ICD-10-CM

## 2023-08-30 DIAGNOSIS — M51362 Other intervertebral disc degeneration, lumbar region with discogenic back pain and lower extremity pain: Secondary | ICD-10-CM | POA: Diagnosis not present

## 2023-08-30 DIAGNOSIS — G8929 Other chronic pain: Secondary | ICD-10-CM

## 2023-08-30 DIAGNOSIS — N390 Urinary tract infection, site not specified: Secondary | ICD-10-CM

## 2023-08-30 MED ORDER — TIZANIDINE HCL 4 MG PO TABS: 4.0000 mg | ORAL_TABLET | Freq: Two times a day (BID) | ORAL | 0 refills | Status: AC

## 2023-08-30 NOTE — Patient Instructions (Addendum)
 MRI lumbar Follow up 5 days after to discuss results  Low back HEP  Tizanadine 4 mg 2x daily

## 2023-08-30 NOTE — Progress Notes (Signed)
 Ben Marsha Gundlach D.[facility] [address] [place] [address] Phone: [phone]   Assessment and Plan:     1. Chronic bilateral low back pain with right-sided sciatica 2. Degeneration of intervertebral disc of lumbar region with discogenic back pain and lower extremity pain 3. Other idiopathic scoliosis, lumbar region -Chronic with exacerbation, subsequent visit - 3+ months of right-sided low back pain with radicular symptoms into right thigh, consistent with lumbar DDD and radiculopathy - Due to failure to improve despite >6 weeks of conservative therapy, degenerative changes seen on prior lumbar x-ray, pain at times >6/10, pain with day-to-day activities, recommend further evaluation with lumbar spine MRI - Start Tylenol 500 to 1000 mg tablets 2-3 times a day for day-to-day pain relief - May use NSAIDs as needed for breakthrough pain.  Recommend limiting chronic NSAIDs to 1-2 doses per week - May start tizanidine 4 mg twice daily as needed for muscle spasms.  Prescription provided - Start HEP for low back and core   15 additional minutes spent for educating Therapeutic Home Exercise Program.  This included exercises focusing on stretching, strengthening, with focus on eccentric aspects.   Long term goals include an improvement in range of motion, strength, endurance as well as avoiding reinjury. Patient's frequency would include in 1-2 times a day, 3-5 times a week for a duration of 6-12 weeks. Proper technique shown and discussed handout in great detail with ATC.  All questions were discussed and answered.    Pertinent previous records reviewed include lumbar x-ray 03/31/2022  Follow Up: 1 week after MRI to review results and discuss treatment plan which could include epidural if appropriate   Subjective:   I, Audie Bleacher, am serving as a Neurosurgeon for Doctor Ulysees Gander  Chief Complaint: low back pain   HPI:  06/03/2023 Felipe Horton   03/31/2022 Chronic problem with worsening symptoms.  Now having some more radicular symptoms noted as well.  Does have x-rays of the hip showing some degenerative disc disease.  Will get a lumbar x-ray to further evaluate.  Discussed with patient about icing regimen, home exercises, which activities to do and which ones to avoid.  Started meloxicam  15 mg daily and gabapentin  200 mg at night follow-up again in 6 to 8 weeks  Updated 06/03/2023 STEVY ROMIG is a 63 y.o. female coming in with complaint of L hip pain. Wants meloxicam  refilled. Patient states that her pain will radiate down the leg. Has been seeing Dr. Cleora Daft for injections. Pain in lumbar spine on R side from lifting her grandkids.     08/30/23 Patient states she has not had any improvement . Low right back feels like a knot. pain radiated around the low back yesterday. Pain will not go away    Relevant Historical Information: Osteoporosis  Additional pertinent review of systems negative.   Current Outpatient Medications:    B Complex Vitamins (VITAMIN B COMPLEX PO), Take by mouth daily., Disp: , Rfl:    CALCIUM PO, Take by mouth daily., Disp: , Rfl:    Docosahexaenoic Acid (DHA OMEGA 3 PO), Take by mouth daily., Disp: , Rfl:    estradiol  (ESTRACE ) 0.1 MG/GM vaginal cream, Apply a fingertip amount to vaginal opening three times weekly, Disp: 42.5 g, Rfl: 12   gabapentin  (NEURONTIN ) 100 MG capsule, Take 2 capsules (200 mg total) by mouth at bedtime., Disp: 180 capsule, Rfl: 0   MAGNESIUM PO, Take by mouth daily., Disp: , Rfl:  meloxicam  (MOBIC ) 15 MG tablet, Take 1 tablet (15 mg total) by mouth daily., Disp: 90 tablet, Rfl: 3   Multiple Vitamins-Minerals (MULTIVITAMIN PO), Take by mouth daily., Disp: , Rfl:    nitrofurantoin , macrocrystal-monohydrate, (MACROBID ) 100 MG capsule, Take 1 capsule daily as needed for UTI prophylaxis, Disp: 30 capsule, Rfl: 1   potassium chloride SA (K-DUR,KLOR-CON) 20 MEQ tablet, Take 20 mEq  by mouth daily., Disp: , Rfl:    SALINE NASAL SPRAY NA, Place into the nose as needed., Disp: , Rfl:    tiZANidine (ZANAFLEX) 4 MG tablet, Take 1 tablet (4 mg total) by mouth in the morning and at bedtime., Disp: 40 tablet, Rfl: 0   VITAMIN D , CHOLECALCIFEROL, PO, Take by mouth daily., Disp: , Rfl:    Objective:     Vitals:   08/30/23 1416  BP: 110/76  Pulse: 76  SpO2: 99%  Weight: 127 lb (57.6 kg)  Height: 5\' 3"  (1.6 m)      Body mass index is 22.5 kg/m.    Physical Exam:    Gen: Appears well, nad, nontoxic and pleasant Psych: Alert and oriented, appropriate mood and affect Neuro: sensation intact, strength is 5/5 in upper and lower extremities, muscle tone wnl Skin: no susupicious lesions or rashes  Back - Normal skin, Spine with normal alignment and no deformity.   Mild tenderness to L3-5 vertebral process palpation.   Bilateral lumbar paraspinous muscles are   tender and without spasm NTTP gluteal musculature Straight leg raise negative Trendelenberg negative Piriformis Test negative Gait normal    Electronically signed by:  Marshall Skeeter D.[facility] 2:51 PM 08/30/23

## 2023-08-31 ENCOUNTER — Ambulatory Visit

## 2023-08-31 DIAGNOSIS — G8929 Other chronic pain: Secondary | ICD-10-CM

## 2023-08-31 DIAGNOSIS — M51362 Other intervertebral disc degeneration, lumbar region with discogenic back pain and lower extremity pain: Secondary | ICD-10-CM

## 2023-08-31 DIAGNOSIS — M545 Low back pain, unspecified: Secondary | ICD-10-CM | POA: Diagnosis not present

## 2023-08-31 DIAGNOSIS — M48061 Spinal stenosis, lumbar region without neurogenic claudication: Secondary | ICD-10-CM | POA: Diagnosis not present

## 2023-08-31 DIAGNOSIS — M5117 Intervertebral disc disorders with radiculopathy, lumbosacral region: Secondary | ICD-10-CM | POA: Diagnosis not present

## 2023-08-31 DIAGNOSIS — F4321 Adjustment disorder with depressed mood: Secondary | ICD-10-CM | POA: Diagnosis not present

## 2023-08-31 DIAGNOSIS — M4727 Other spondylosis with radiculopathy, lumbosacral region: Secondary | ICD-10-CM | POA: Diagnosis not present

## 2023-08-31 DIAGNOSIS — M5416 Radiculopathy, lumbar region: Secondary | ICD-10-CM

## 2023-08-31 DIAGNOSIS — M4126 Other idiopathic scoliosis, lumbar region: Secondary | ICD-10-CM

## 2023-08-31 DIAGNOSIS — M4726 Other spondylosis with radiculopathy, lumbar region: Secondary | ICD-10-CM | POA: Diagnosis not present

## 2023-09-07 NOTE — Progress Notes (Unsigned)
    Ben Jackson D.[facility] [address] [place] [address] Phone: [phone]   Assessment and Plan:     There are no diagnoses linked to this encounter.  ***   Pertinent previous records reviewed include ***    Follow Up: ***     Subjective:   I, Kiara Gray, am serving as a Neurosurgeon for Doctor Ulysees Gander   Chief Complaint: low back pain    HPI:  06/03/2023 Kiara Gray  03/31/2022 Chronic problem with worsening symptoms.  Now having some more radicular symptoms noted as well.  Does have x-rays of the hip showing some degenerative disc disease.  Will get a lumbar x-ray to further evaluate.  Discussed with patient about icing regimen, home exercises, which activities to do and which ones to avoid.  Started meloxicam  15 mg daily and gabapentin  200 mg at night follow-up again in 6 to 8 weeks   Updated 06/03/2023 Kiara Gray is a 63 y.o. female coming in with complaint of L hip pain. Wants meloxicam  refilled. Patient states that her pain will radiate down the leg. Has been seeing Dr. Cleora Daft for injections. Pain in lumbar spine on R side from lifting her grandkids.      08/30/23 Patient states she has not had any improvement . Low right back feels like a knot. pain radiated around the low back yesterday. Pain will not go away    09/08/2023 Patient states   Relevant Historical Information: Osteoporosis  Additional pertinent review of systems negative.   Current Outpatient Medications:    B Complex Vitamins (VITAMIN B COMPLEX PO), Take by mouth daily., Disp: , Rfl:    CALCIUM PO, Take by mouth daily., Disp: , Rfl:    Docosahexaenoic Acid (DHA OMEGA 3 PO), Take by mouth daily., Disp: , Rfl:    estradiol  (ESTRACE ) 0.1 MG/GM vaginal cream, Apply a fingertip amount to vaginal opening three times weekly, Disp: 42.5 g, Rfl: 12   gabapentin  (NEURONTIN ) 100 MG capsule, Take 2 capsules (200 mg total) by mouth at bedtime., Disp: 180  capsule, Rfl: 0   MAGNESIUM PO, Take by mouth daily., Disp: , Rfl:    meloxicam  (MOBIC ) 15 MG tablet, Take 1 tablet (15 mg total) by mouth daily., Disp: 90 tablet, Rfl: 3   Multiple Vitamins-Minerals (MULTIVITAMIN PO), Take by mouth daily., Disp: , Rfl:    nitrofurantoin , macrocrystal-monohydrate, (MACROBID ) 100 MG capsule, TAKE ONE CAPSULE BY MOUTH DAILY AS NEEDED FOR uti prophylaxis, Disp: 30 capsule, Rfl: 1   potassium chloride SA (K-DUR,KLOR-CON) 20 MEQ tablet, Take 20 mEq by mouth daily., Disp: , Rfl:    SALINE NASAL SPRAY NA, Place into the nose as needed., Disp: , Rfl:    tiZANidine  (ZANAFLEX ) 4 MG tablet, Take 1 tablet (4 mg total) by mouth in the morning and at bedtime., Disp: 40 tablet, Rfl: 0   VITAMIN D , CHOLECALCIFEROL, PO, Take by mouth daily., Disp: , Rfl:    Objective:     There were no vitals filed for this visit.    There is no height or weight on file to calculate BMI.    Physical Exam:    ***   Electronically signed by:  Marshall Skeeter D.[facility] 7:51 AM 09/07/23

## 2023-09-08 ENCOUNTER — Ambulatory Visit: Admitting: Sports Medicine

## 2023-09-08 VITALS — HR 94 | Ht 63.0 in | Wt 125.0 lb

## 2023-09-08 DIAGNOSIS — M7062 Trochanteric bursitis, left hip: Secondary | ICD-10-CM | POA: Diagnosis not present

## 2023-09-08 DIAGNOSIS — G8929 Other chronic pain: Secondary | ICD-10-CM | POA: Diagnosis not present

## 2023-09-08 DIAGNOSIS — M48061 Spinal stenosis, lumbar region without neurogenic claudication: Secondary | ICD-10-CM

## 2023-09-08 DIAGNOSIS — M51362 Other intervertebral disc degeneration, lumbar region with discogenic back pain and lower extremity pain: Secondary | ICD-10-CM

## 2023-09-08 NOTE — Patient Instructions (Signed)
Epidural right L5-S1 Follow up 2 weeks after to discuss results

## 2023-09-17 DIAGNOSIS — F4321 Adjustment disorder with depressed mood: Secondary | ICD-10-CM | POA: Diagnosis not present

## 2023-09-21 DIAGNOSIS — F4321 Adjustment disorder with depressed mood: Secondary | ICD-10-CM | POA: Diagnosis not present

## 2023-09-23 NOTE — Discharge Instructions (Signed)

## 2023-09-24 ENCOUNTER — Ambulatory Visit
Admission: RE | Admit: 2023-09-24 | Discharge: 2023-09-24 | Disposition: A | Discharge status: Home / Self Care | Source: Ambulatory Visit | Attending: Sports Medicine | Admitting: Sports Medicine

## 2023-09-24 DIAGNOSIS — M48061 Spinal stenosis, lumbar region without neurogenic claudication: Secondary | ICD-10-CM

## 2023-09-24 DIAGNOSIS — G8929 Other chronic pain: Secondary | ICD-10-CM

## 2023-09-24 DIAGNOSIS — M51362 Other intervertebral disc degeneration, lumbar region with discogenic back pain and lower extremity pain: Secondary | ICD-10-CM

## 2023-09-24 DIAGNOSIS — M4807 Spinal stenosis, lumbosacral region: Secondary | ICD-10-CM | POA: Diagnosis not present

## 2023-09-24 DIAGNOSIS — M4727 Other spondylosis with radiculopathy, lumbosacral region: Secondary | ICD-10-CM | POA: Diagnosis not present

## 2023-09-24 MED ORDER — IOPAMIDOL (ISOVUE-M 200) INJECTION 41%
1.0000 mL | Freq: Once | INTRAMUSCULAR | Status: AC
  Administered 2023-09-24: 1 mL via EPIDURAL

## 2023-09-24 MED ORDER — METHYLPREDNISOLONE ACETATE 40 MG/ML INJ SUSP (RADIOLOG
80.0000 mg | Freq: Once | INTRAMUSCULAR | Status: AC
  Administered 2023-09-24: 80 mg via EPIDURAL

## 2023-09-28 DIAGNOSIS — F4321 Adjustment disorder with depressed mood: Secondary | ICD-10-CM | POA: Diagnosis not present

## 2023-10-19 DIAGNOSIS — L821 Other seborrheic keratosis: Secondary | ICD-10-CM | POA: Diagnosis not present

## 2023-10-19 DIAGNOSIS — L820 Inflamed seborrheic keratosis: Secondary | ICD-10-CM | POA: Diagnosis not present

## 2023-10-19 DIAGNOSIS — L814 Other melanin hyperpigmentation: Secondary | ICD-10-CM | POA: Diagnosis not present

## 2023-10-19 DIAGNOSIS — Z08 Encounter for follow-up examination after completed treatment for malignant neoplasm: Secondary | ICD-10-CM | POA: Diagnosis not present

## 2023-10-19 DIAGNOSIS — D225 Melanocytic nevi of trunk: Secondary | ICD-10-CM | POA: Diagnosis not present

## 2024-01-17 NOTE — Progress Notes (Unsigned)
 [facility] [address] [place] [address] Phone: [phone]   Assessment and Plan:     1. Greater trochanteric bursitis of left hip (Primary) -Chronic with exacerbation, subsequent visit - Consistent with recurrent greater trochanteric bursitis of left hip - Patient has had significant relief with greater trochanteric CSI in the past with last injection on 09/08/2023 Providing >3 months relief.  Elected for repeat greater trochanteric CSI.  Tolerated well per note below - Use Tylenol 500 to 1000 mg tablets 2-3 times a day for day-to-day pain relief   2. Chronic bilateral low back pain with right-sided sciatica 3. Degeneration of intervertebral disc of lumbar region with discogenic back pain and lower extremity pain 4. Lumbar foraminal stenosis -Chronic with exacerbation, subsequent visit - Patient received significant relief after epidural CSI on 09/24/2023 to right sided L5-S1.  Patient states 90-100% improvement after injection, decreased need for pain medication, and improved function after epidural.  Relief lasted for nearly 4 months with symptoms gradually recurring over the past 2 weeks.  I believe patient would benefit from repeat right sided epidural to L5-S1 - Continue tizanidine  4 mg twice daily as needed for muscle spasms - Use Tylenol 500 to 1000 mg tablets 2-3 times a day for day-to-day pain relief     Pertinent previous records reviewed include none   Follow Up: As needed if no improvement or worsening of symptoms   Subjective:   I, Kiara Gray, am serving as a Neurosurgeon for [employer]   Chief Complaint: low back pain    HPI:  06/03/2023 Kiara Gray  03/31/2022 Chronic problem with worsening symptoms.  Now having some more radicular symptoms noted as well.  Does have x-rays of the hip showing some degenerative disc disease.  Will get a lumbar x-ray to further evaluate.  Discussed with patient about  icing regimen, home exercises, which activities to do and which ones to avoid.  Started meloxicam  15 mg daily and gabapentin  200 mg at night follow-up again in 6 to 8 weeks   Updated 06/03/2023 Kiara Gray is a 63 y.o. female coming in with complaint of L hip pain. Wants meloxicam  refilled. Patient states that her pain will radiate down the leg. Has been seeing Dr. Leonce for injections. Pain in lumbar spine on R side from lifting her grandkids.      08/30/23 Patient states she has not had any improvement . Low right back feels like a knot. pain radiated around the low back yesterday. Pain will not go away    09/08/2023 Patient states MRI results. Pain in left hip is back would like  a CSI   01/18/2024 Patient states ready for hip injection. Low back pain is starting to come back on the right side, states pain is the same as when she came in the first time   Relevant Historical Information: Osteoporosis Additional pertinent review of systems negative.   Current Outpatient Medications:    B Complex Vitamins (VITAMIN B COMPLEX PO), Take by mouth daily., Disp: , Rfl:    CALCIUM PO, Take by mouth daily., Disp: , Rfl:    Docosahexaenoic Acid (DHA OMEGA 3 PO), Take by mouth daily., Disp: , Rfl:    estradiol  (ESTRACE ) 0.1 MG/GM vaginal cream, Apply a fingertip amount to vaginal opening three times weekly, Disp: 42.5 g, Rfl: 12   gabapentin  (NEURONTIN ) 100 MG capsule, Take 2 capsules (200 mg total) by mouth at bedtime., Disp: 180 capsule, Rfl:  0   MAGNESIUM PO, Take by mouth daily., Disp: , Rfl:    meloxicam  (MOBIC ) 15 MG tablet, Take 1 tablet (15 mg total) by mouth daily., Disp: 90 tablet, Rfl: 3   Multiple Vitamins-Minerals (MULTIVITAMIN PO), Take by mouth daily., Disp: , Rfl:    nitrofurantoin , macrocrystal-monohydrate, (MACROBID ) 100 MG capsule, TAKE ONE CAPSULE BY MOUTH DAILY AS NEEDED FOR uti prophylaxis, Disp: 30 capsule, Rfl: 1   potassium chloride SA (K-DUR,KLOR-CON) 20 MEQ tablet,  Take 20 mEq by mouth daily., Disp: , Rfl:    SALINE NASAL SPRAY NA, Place into the nose as needed., Disp: , Rfl:    tiZANidine  (ZANAFLEX ) 4 MG tablet, Take 1 tablet (4 mg total) by mouth in the morning and at bedtime., Disp: 40 tablet, Rfl: 0   VITAMIN D , CHOLECALCIFEROL, PO, Take by mouth daily., Disp: , Rfl:    Objective:     Vitals:   01/18/24 1436  Pulse: 75  SpO2: 97%  Weight: 125 lb (56.7 kg)  Height: 5' 3 (1.6 m)      Body mass index is 22.14 kg/m.    Physical Exam:    General: awake, alert, and oriented no acute distress, nontoxic Skin: no suspicious lesions or rashes Neuro:sensation intact distally with no deficits, normal muscle tone, no atrophy, strength 5/5 in all tested lower ext groups Psych: normal mood and affect, speech clear   Left hip: No deformity, swelling or wasting ROM Flexion 90, ext 30, IR 45, ER 45 TTP greater trochanter and mildly to gluteal musculature NTTP over the hip flexors,   si joint, lumbar spine Negative log roll with FROM Negative FABER Negative FADIR, though reproduced mild lateral hip pain Negative Piriformis test Negative trendelenberg Gait normal   Back - Normal skin, Spine with normal alignment and no deformity.   Mild tenderness to L3-5 vertebral process palpation.   Bilateral lumbar paraspinous muscles are   tender and without spasm NTTP gluteal musculature, left greater trochanter Straight leg raise negative Trendelenberg negative Piriformis Test negative Gait normal     Electronically signed by:  Odis Mace D.[facility] 2:49 PM 01/18/24

## 2024-01-18 ENCOUNTER — Ambulatory Visit: Admitting: Sports Medicine

## 2024-01-18 VITALS — HR 75 | Ht 63.0 in | Wt 125.0 lb

## 2024-01-18 DIAGNOSIS — M51362 Other intervertebral disc degeneration, lumbar region with discogenic back pain and lower extremity pain: Secondary | ICD-10-CM

## 2024-01-18 DIAGNOSIS — G8929 Other chronic pain: Secondary | ICD-10-CM

## 2024-01-18 DIAGNOSIS — M48061 Spinal stenosis, lumbar region without neurogenic claudication: Secondary | ICD-10-CM

## 2024-01-18 DIAGNOSIS — M7062 Trochanteric bursitis, left hip: Secondary | ICD-10-CM | POA: Diagnosis not present

## 2024-01-18 NOTE — Patient Instructions (Signed)
 Epidural right L5-S1  Follow up as needed

## 2024-02-15 ENCOUNTER — Encounter (HOSPITAL_BASED_OUTPATIENT_CLINIC_OR_DEPARTMENT_OTHER): Payer: Self-pay | Admitting: Obstetrics & Gynecology

## 2024-02-15 ENCOUNTER — Ambulatory Visit (INDEPENDENT_AMBULATORY_CARE_PROVIDER_SITE_OTHER): Payer: BC Managed Care – PPO | Admitting: Obstetrics & Gynecology

## 2024-02-15 VITALS — BP 101/78 | HR 76 | Ht 64.0 in | Wt 127.8 lb

## 2024-02-15 DIAGNOSIS — M810 Age-related osteoporosis without current pathological fracture: Secondary | ICD-10-CM

## 2024-02-15 DIAGNOSIS — Z9889 Other specified postprocedural states: Secondary | ICD-10-CM

## 2024-02-15 DIAGNOSIS — N390 Urinary tract infection, site not specified: Secondary | ICD-10-CM | POA: Diagnosis not present

## 2024-02-15 DIAGNOSIS — Z01419 Encounter for gynecological examination (general) (routine) without abnormal findings: Secondary | ICD-10-CM | POA: Diagnosis not present

## 2024-02-15 DIAGNOSIS — Z1211 Encounter for screening for malignant neoplasm of colon: Secondary | ICD-10-CM

## 2024-02-15 DIAGNOSIS — N898 Other specified noninflammatory disorders of vagina: Secondary | ICD-10-CM

## 2024-02-15 MED ORDER — NITROFURANTOIN MONOHYD MACRO 100 MG PO CAPS: ORAL_CAPSULE | ORAL | 2 refills | Status: AC

## 2024-02-15 MED ORDER — ESTRADIOL 0.01 % VA CREA: TOPICAL_CREAM | VAGINAL | 4 refills | Status: AC

## 2024-02-15 NOTE — Progress Notes (Signed)
 ANNUAL EXAM Patient name: Kiara Gray MRN [id number]  Date of birth: 1960/11/17 Chief Complaint:   Gynecologic Exam  History of Present Illness:   Kiara Gray is a 63 y.o. G31P3003 Caucasian female being seen today for a routine annual exam.   Denies vaginal bleeding.  Doing well except for hip/back issues.    Patient's last menstrual period was 04/27/2008.   Last pap 02/10/2023. Results were: NILM w/ HRHPV negative. H/O abnormal pap: no Last mammogram: 02/15/2023. Results were: normal. Family h/o breast cancer: no Last colonoscopy: Patient reports that she had a colonoscopy about fourteen years ago. Family h/o colorectal cancer: no.  Willing to do cologuard.   DEXA:  01/2023.  Stable osteoporosis -2.7.        02/15/2024    2:14 PM 02/10/2023    2:32 PM 01/20/2022    9:21 AM 01/01/2021    2:20 PM  Depression screen PHQ 2/9  Decreased Interest 1 0 0 0  Down, Depressed, Hopeless 0 0 0 0  PHQ - 2 Score 1 0 0 0  Altered sleeping 1     Tired, decreased energy 1     Change in appetite 0     Feeling bad or failure about yourself  0     Trouble concentrating 0     Moving slowly or fidgety/restless 0     Suicidal thoughts 0     PHQ-9 Score 3       Review of Systems:   Pertinent items are noted in HPI Denies any urinary or bowel changes.  Denies pelvic pain.   Pertinent History Reviewed:  Reviewed past medical,surgical, social and family history.  Reviewed problem list, medications and allergies. Physical Assessment:   Vitals:   02/15/24 1414  BP: 101/78  Pulse: 76  SpO2: 100%  Weight: 127 lb 12.8 oz (58 kg)  Height: 5' 4 (1.626 m)  Body mass index is 21.94 kg/m.        Physical Examination:   General appearance - well appearing, and in no distress  Mental status - alert, oriented to person, place, and time  Psych:  She has a normal mood and affect  Skin - warm and dry, normal color, no suspicious lesions noted  Chest - effort normal, all lung fields clear  to auscultation bilaterally  Heart - normal rate and regular rhythm  Neck:  midline trachea, no thyromegaly or nodules  Breasts - breasts appear normal, no suspicious masses, no skin or nipple changes or  axillary nodes  Abdomen - soft, nontender, nondistended, no masses or organomegaly  Pelvic - VULVA: normal appearing vulva with no masses, tenderness or lesions   VAGINA: normal appearing vagina with normal color and discharge, no lesions   CERVIX: normal appearing cervix without discharge or lesions, no CMT  Thin prep pap is not indicated today  UTERUS: uterus is felt to be normal size, shape, consistency and nontender   ADNEXA: No adnexal masses or tenderness noted.  Rectal - normal rectal, good sphincter tone, no masses felt.   Extremities:  No swelling or varicosities noted  Chaperone present for exam  No results found for this or any previous visit (from the past 24 hours).  Assessment & Plan:  1. Well woman exam with routine gynecological exam (Primary) - Pap smear 2024.  Not indicated today.  - Mammogram 01/2023 - Colonoscopy overdue.  Willing to do cologuard so ordered today. - Bone mineral density 01/2023 - lab work done with PCP,  Dr. Nanci - vaccines reviewed/updated  2. Vaginal dryness - estradiol  (ESTRACE ) 0.01 % CREA vaginal cream; 1 gram vaginally twice weekly  Dispense: 42.5 g; Refill: 4  3. Colon cancer screening - Cologuard  4. Recurrent UTI - nitrofurantoin , macrocrystal-monohydrate, (MACROBID ) 100 MG capsule; Take 1 capsule daily as needed for prophylaxis.  With symptoms, take twice daily for 5 days.  Dispense: 30 capsule; Refill: 2  5. S/P endometrial ablation  6. Age-related osteoporosis without current pathological fracture - on Vit D and calcium  Orders Placed This Encounter  Procedures   Cologuard    Meds:  Meds ordered this encounter  Medications   nitrofurantoin , macrocrystal-monohydrate, (MACROBID ) 100 MG capsule    Sig: Take 1 capsule  daily as needed for prophylaxis.  With symptoms, take twice daily for 5 days.    Dispense:  30 capsule    Refill:  2   estradiol  (ESTRACE ) 0.01 % CREA vaginal cream    Sig: 1 gram vaginally twice weekly    Dispense:  42.5 g    Refill:  4    Follow-up: No follow-ups on file.  Ronal GORMAN [place], MD 02/15/2024 3:03 PM

## 2024-02-25 DIAGNOSIS — R5383 Other fatigue: Secondary | ICD-10-CM | POA: Diagnosis not present

## 2024-02-25 DIAGNOSIS — R52 Pain, unspecified: Secondary | ICD-10-CM | POA: Diagnosis not present

## 2024-02-25 DIAGNOSIS — J029 Acute pharyngitis, unspecified: Secondary | ICD-10-CM | POA: Diagnosis not present

## 2024-03-10 LAB — COLOGUARD: COLOGUARD: NEGATIVE

## 2024-03-12 ENCOUNTER — Ambulatory Visit (HOSPITAL_BASED_OUTPATIENT_CLINIC_OR_DEPARTMENT_OTHER): Payer: Self-pay | Admitting: Obstetrics & Gynecology

## 2024-03-15 ENCOUNTER — Encounter (HOSPITAL_BASED_OUTPATIENT_CLINIC_OR_DEPARTMENT_OTHER): Payer: Self-pay

## 2024-03-15 ENCOUNTER — Other Ambulatory Visit: Payer: Self-pay | Admitting: Family Medicine

## 2024-03-15 DIAGNOSIS — Z1231 Encounter for screening mammogram for malignant neoplasm of breast: Secondary | ICD-10-CM | POA: Diagnosis not present

## 2024-06-05 ENCOUNTER — Ambulatory Visit: Admitting: Sports Medicine
